# Patient Record
Sex: Male | Born: 1985 | Race: White | Hispanic: No | Marital: Married | Smoking: Never smoker
Health system: Southern US, Community
[De-identification: ages and names within clinical notes are randomized; demographics above are authoritative.]

## PROBLEM LIST (undated history)

## (undated) DIAGNOSIS — K5900 Constipation, unspecified: Secondary | ICD-10-CM

## (undated) DIAGNOSIS — K649 Unspecified hemorrhoids: Secondary | ICD-10-CM

## (undated) DIAGNOSIS — R110 Nausea: Secondary | ICD-10-CM

## (undated) DIAGNOSIS — R197 Diarrhea, unspecified: Secondary | ICD-10-CM

## (undated) DIAGNOSIS — F419 Anxiety disorder, unspecified: Secondary | ICD-10-CM

## (undated) HISTORY — DX: Unspecified hemorrhoids: K64.9

## (undated) HISTORY — DX: Nausea: R11.0

## (undated) HISTORY — PX: OTHER SURGICAL HISTORY: SHX169

## (undated) HISTORY — PX: APPENDECTOMY: SHX54

## (undated) HISTORY — DX: Constipation, unspecified: K59.00

## (undated) HISTORY — DX: Diarrhea, unspecified: R19.7

---
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
## 2005-05-29 ENCOUNTER — Emergency Department (HOSPITAL_COMMUNITY): Admission: EM | Admit: 2005-05-29 | Discharge: 2005-05-29 | Payer: Self-pay | Admitting: Emergency Medicine

## 2007-03-19 ENCOUNTER — Emergency Department (HOSPITAL_COMMUNITY): Admission: EM | Admit: 2007-03-19 | Discharge: 2007-03-19 | Payer: Self-pay | Admitting: Emergency Medicine

## 2015-04-01 ENCOUNTER — Ambulatory Visit: Payer: Self-pay

## 2015-04-01 ENCOUNTER — Other Ambulatory Visit: Payer: Self-pay | Admitting: Occupational Medicine

## 2015-04-01 DIAGNOSIS — M25531 Pain in right wrist: Secondary | ICD-10-CM

## 2015-05-06 ENCOUNTER — Other Ambulatory Visit: Payer: Self-pay | Admitting: Orthopedic Surgery

## 2015-05-15 ENCOUNTER — Encounter (HOSPITAL_BASED_OUTPATIENT_CLINIC_OR_DEPARTMENT_OTHER): Payer: Self-pay | Admitting: *Deleted
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
## 2015-05-21 ENCOUNTER — Encounter (HOSPITAL_BASED_OUTPATIENT_CLINIC_OR_DEPARTMENT_OTHER): Payer: Self-pay | Admitting: Certified Registered"

## 2015-05-21 ENCOUNTER — Ambulatory Visit (HOSPITAL_BASED_OUTPATIENT_CLINIC_OR_DEPARTMENT_OTHER): Payer: Worker's Compensation | Admitting: Certified Registered"

## 2015-05-21 ENCOUNTER — Encounter (HOSPITAL_BASED_OUTPATIENT_CLINIC_OR_DEPARTMENT_OTHER)
Admission: RE | Disposition: A | Discharge status: Home / Self Care | Payer: Self-pay | Source: Ambulatory Visit | Attending: Orthopedic Surgery

## 2015-05-21 ENCOUNTER — Ambulatory Visit (HOSPITAL_BASED_OUTPATIENT_CLINIC_OR_DEPARTMENT_OTHER)
Admission: RE | Admit: 2015-05-21 | Discharge: 2015-05-21 | Disposition: A | Discharge status: Home / Self Care | Payer: Worker's Compensation | Source: Ambulatory Visit | Attending: Orthopedic Surgery | Admitting: Orthopedic Surgery

## 2015-05-21 DIAGNOSIS — Z87891 Personal history of nicotine dependence: Secondary | ICD-10-CM | POA: Diagnosis not present

## 2015-05-21 DIAGNOSIS — Y9289 Other specified places as the place of occurrence of the external cause: Secondary | ICD-10-CM | POA: Diagnosis not present

## 2015-05-21 DIAGNOSIS — Y9389 Activity, other specified: Secondary | ICD-10-CM | POA: Insufficient documentation

## 2015-05-21 DIAGNOSIS — S63591A Other specified sprain of right wrist, initial encounter: Secondary | ICD-10-CM | POA: Insufficient documentation

## 2015-05-21 DIAGNOSIS — Y998 Other external cause status: Secondary | ICD-10-CM | POA: Diagnosis not present

## 2015-05-21 DIAGNOSIS — M25531 Pain in right wrist: Secondary | ICD-10-CM | POA: Diagnosis present

## 2015-05-21 DIAGNOSIS — W19XXXA Unspecified fall, initial encounter: Secondary | ICD-10-CM | POA: Insufficient documentation

## 2015-05-21 HISTORY — DX: Anxiety disorder, unspecified: F41.9

## 2015-05-21 HISTORY — PX: WRIST ARTHROSCOPY WITH FOVEAL TRIANGULAR FIBROCARTILAGE COMPLEX REPAIR: SHX6403

## 2015-05-21 LAB — POCT HEMOGLOBIN-HEMACUE: Hemoglobin: 15.4 g/dL (ref 13.0–17.0)

## 2015-05-21 SURGERY — WRIST ARTHROSCOPY WITH FOVEAL TRIANGULAR FIBROCARTILAGE COMPLEX REPAIR
Anesthesia: General | Laterality: Right

## 2015-05-21 MED ORDER — PROMETHAZINE HCL 25 MG/ML IJ SOLN
6.2500 mg | Freq: Once | INTRAMUSCULAR | Status: AC
  Administered 2015-05-21: 6.25 mg via INTRAVENOUS

## 2015-05-21 MED ORDER — FENTANYL CITRATE (PF) 100 MCG/2ML IJ SOLN
INTRAMUSCULAR | Status: AC
  Filled 2015-05-21: qty 4

## 2015-05-21 MED ORDER — SODIUM CHLORIDE 0.9 % IR SOLN
Status: DC | PRN
  Administered 2015-05-21: 3000 mL

## 2015-05-21 MED ORDER — LIDOCAINE HCL (CARDIAC) 20 MG/ML IV SOLN
INTRAVENOUS | Status: DC | PRN
  Administered 2015-05-21: 30 mg via INTRAVENOUS

## 2015-05-21 MED ORDER — LACTATED RINGERS IV SOLN
INTRAVENOUS | Status: DC
  Administered 2015-05-21 (×2): via INTRAVENOUS

## 2015-05-21 MED ORDER — PROMETHAZINE HCL 25 MG/ML IJ SOLN
INTRAMUSCULAR | Status: AC
Start: 2015-05-21 — End: 2015-05-21
  Filled 2015-05-21: qty 1

## 2015-05-21 MED ORDER — OXYCODONE HCL 5 MG PO TABS: 5.0000 mg | ORAL_TABLET | Freq: Once | ORAL | Status: DC | PRN

## 2015-05-21 MED ORDER — FENTANYL CITRATE (PF) 100 MCG/2ML IJ SOLN
INTRAMUSCULAR | Status: AC
  Filled 2015-05-21: qty 2

## 2015-05-21 MED ORDER — MIDAZOLAM HCL 2 MG/2ML IJ SOLN
INTRAMUSCULAR | Status: AC
  Filled 2015-05-21: qty 2

## 2015-05-21 MED ORDER — MIDAZOLAM HCL 2 MG/2ML IJ SOLN
1.0000 mg | INTRAMUSCULAR | Status: DC | PRN
  Administered 2015-05-21: 1 mg via INTRAVENOUS
  Administered 2015-05-21: 2 mg via INTRAVENOUS

## 2015-05-21 MED ORDER — DEXAMETHASONE SODIUM PHOSPHATE 10 MG/ML IJ SOLN
INTRAMUSCULAR | Status: DC | PRN
Start: 2015-05-21 — End: 2015-05-21
  Administered 2015-05-21: 10 mg via INTRAVENOUS

## 2015-05-21 MED ORDER — MEPERIDINE HCL 25 MG/ML IJ SOLN: 6.2500 mg | INTRAMUSCULAR | Status: DC | PRN

## 2015-05-21 MED ORDER — SUCCINYLCHOLINE CHLORIDE 20 MG/ML IJ SOLN
INTRAMUSCULAR | Status: AC
  Filled 2015-05-21: qty 1

## 2015-05-21 MED ORDER — CEFAZOLIN SODIUM-DEXTROSE 2-3 GM-% IV SOLR
2.0000 g | INTRAVENOUS | Status: AC
  Administered 2015-05-21: 2 g via INTRAVENOUS

## 2015-05-21 MED ORDER — SCOPOLAMINE 1 MG/3DAYS TD PT72: 1.0000 | MEDICATED_PATCH | Freq: Once | TRANSDERMAL | Status: DC | PRN

## 2015-05-21 MED ORDER — PROPOFOL 10 MG/ML IV BOLUS
INTRAVENOUS | Status: DC | PRN
  Administered 2015-05-21: 200 mg via INTRAVENOUS

## 2015-05-21 MED ORDER — CEFAZOLIN SODIUM-DEXTROSE 2-3 GM-% IV SOLR
INTRAVENOUS | Status: AC
  Filled 2015-05-21: qty 50

## 2015-05-21 MED ORDER — FENTANYL CITRATE (PF) 100 MCG/2ML IJ SOLN
50.0000 ug | INTRAMUSCULAR | Status: DC | PRN
  Administered 2015-05-21: 100 ug via INTRAVENOUS

## 2015-05-21 MED ORDER — HYDROMORPHONE HCL 1 MG/ML IJ SOLN: 0.2500 mg | INTRAMUSCULAR | Status: DC | PRN

## 2015-05-21 MED ORDER — GLYCOPYRROLATE 0.2 MG/ML IJ SOLN: 0.2000 mg | Freq: Once | INTRAMUSCULAR | Status: DC | PRN

## 2015-05-21 MED ORDER — LACTATED RINGERS IV SOLN: INTRAVENOUS | Status: DC

## 2015-05-21 MED ORDER — ONDANSETRON HCL 4 MG/2ML IJ SOLN
INTRAMUSCULAR | Status: DC | PRN
  Administered 2015-05-21: 4 mg via INTRAVENOUS

## 2015-05-21 MED ORDER — BUPIVACAINE-EPINEPHRINE (PF) 0.5% -1:200000 IJ SOLN
INTRAMUSCULAR | Status: DC | PRN
  Administered 2015-05-21: 25 mL via PERINEURAL

## 2015-05-21 MED ORDER — OXYCODONE HCL 5 MG/5ML PO SOLN: 5.0000 mg | Freq: Once | ORAL | Status: DC | PRN

## 2015-05-21 SURGICAL SUPPLY — 78 items
APL SKNCLS STERI-STRIP NONHPOA (GAUZE/BANDAGES/DRESSINGS)
BENZOIN TINCTURE PRP APPL 2/3 (GAUZE/BANDAGES/DRESSINGS) IMPLANT
BLADE MINI RND TIP GREEN BEAV (BLADE) IMPLANT
BLADE SURG 15 STRL LF DISP TIS (BLADE) ×1 IMPLANT
BLADE SURG 15 STRL SS (BLADE) ×3
BNDG CMPR 9X4 STRL LF SNTH (GAUZE/BANDAGES/DRESSINGS) ×1
BNDG COHESIVE 4X5 TAN STRL (GAUZE/BANDAGES/DRESSINGS) ×3 IMPLANT
BNDG ESMARK 4X9 LF (GAUZE/BANDAGES/DRESSINGS) ×3 IMPLANT
BNDG GAUZE ELAST 4 BULKY (GAUZE/BANDAGES/DRESSINGS) ×6 IMPLANT
BUR CUDA 2.9 (BURR) ×1 IMPLANT
BUR CUDA 2.9MM (BURR) ×1
BUR FULL RADIUS 2.0 (BURR) IMPLANT
BUR FULL RADIUS 2.0MM (BURR)
BUR FULL RADIUS 2.9 (BURR) IMPLANT
BUR FULL RADIUS 2.9MM (BURR)
BUR GATOR 2.9 (BURR) IMPLANT
BUR GATOR 2.9MM (BURR)
CANISTER SUCT 1200ML W/VALVE (MISCELLANEOUS) ×3 IMPLANT
CHLORAPREP W/TINT 26ML (MISCELLANEOUS) ×3 IMPLANT
CLOSURE WOUND 1/2 X4 (GAUZE/BANDAGES/DRESSINGS)
CORDS BIPOLAR (ELECTRODE) IMPLANT
COVER BACK TABLE 60X90IN (DRAPES) ×3 IMPLANT
COVER MAYO STAND STRL (DRAPES) ×3 IMPLANT
CUFF TOURNIQUET SINGLE 18IN (TOURNIQUET CUFF) ×3 IMPLANT
DECANTER SPIKE VIAL GLASS SM (MISCELLANEOUS) IMPLANT
DRAPE EXTREMITY T 121X128X90 (DRAPE) ×3 IMPLANT
DRAPE OEC MINIVIEW 54X84 (DRAPES) IMPLANT
DRAPE SURG 17X23 STRL (DRAPES) ×3 IMPLANT
DRSG EMULSION OIL 3X3 NADH (GAUZE/BANDAGES/DRESSINGS) ×3 IMPLANT
DRSG TEGADERM 4X4.75 (GAUZE/BANDAGES/DRESSINGS) IMPLANT
GAUZE SPONGE 4X4 12PLY STRL (GAUZE/BANDAGES/DRESSINGS) ×3 IMPLANT
GLOVE BIO SURGEON STRL SZ 6.5 (GLOVE) ×1 IMPLANT
GLOVE BIO SURGEON STRL SZ7.5 (GLOVE) ×3 IMPLANT
GLOVE BIO SURGEONS STRL SZ 6.5 (GLOVE) ×1
GLOVE BIOGEL PI IND STRL 7.0 (GLOVE) ×1 IMPLANT
GLOVE BIOGEL PI IND STRL 8 (GLOVE) ×1 IMPLANT
GLOVE BIOGEL PI INDICATOR 7.0 (GLOVE) ×6
GLOVE BIOGEL PI INDICATOR 8 (GLOVE) ×2
GLOVE ECLIPSE 6.5 STRL STRAW (GLOVE) ×5 IMPLANT
GOWN STRL REUS W/ TWL LRG LVL3 (GOWN DISPOSABLE) ×2 IMPLANT
GOWN STRL REUS W/TWL LRG LVL3 (GOWN DISPOSABLE) ×6
GOWN STRL REUS W/TWL XL LVL3 (GOWN DISPOSABLE) ×3 IMPLANT
LIQUID BAND (GAUZE/BANDAGES/DRESSINGS) IMPLANT
MANIFOLD NEPTUNE II (INSTRUMENTS) IMPLANT
NEEDLE HYPO 22GX1.5 SAFETY (NEEDLE) ×3 IMPLANT
PACK BASIN DAY SURGERY FS (CUSTOM PROCEDURE TRAY) ×3 IMPLANT
PADDING CAST ABS 4INX4YD NS (CAST SUPPLIES) ×2
PADDING CAST ABS COTTON 4X4 ST (CAST SUPPLIES) ×1 IMPLANT
RETRIEVER SUT HEWSON (MISCELLANEOUS) IMPLANT
SET SM JOINT TUBING/CANN (CANNULA) ×3 IMPLANT
SHEET MEDIUM DRAPE 40X70 STRL (DRAPES) IMPLANT
SLING ARM FOAM STRAP LRG (SOFTGOODS) ×2 IMPLANT
SLING ARM LRG ADULT FOAM STRAP (SOFTGOODS) IMPLANT
SLING ARM MED ADULT FOAM STRAP (SOFTGOODS) IMPLANT
SLING ARM SM FOAM STRAP (SOFTGOODS) IMPLANT
SLING ARM XL FOAM STRAP (SOFTGOODS) IMPLANT
SPLINT FIBERGLASS 3X35 (CAST SUPPLIES) ×2 IMPLANT
STOCKINETTE 6  STRL (DRAPES) ×2
STOCKINETTE 6 STRL (DRAPES) ×1 IMPLANT
STRIP CLOSURE SKIN 1/2X4 (GAUZE/BANDAGES/DRESSINGS) IMPLANT
SUT ETHIBOND 2 OS 4 DA (SUTURE) IMPLANT
SUT LASSO 70 DEG MICRO SHORT (SUTURE) ×3
SUT LASSO MICRO MINOR BEND (SUTURE) ×2 IMPLANT
SUT LASSO MICRO STR (SUTURE) ×2 IMPLANT
SUT PDS AB 0 CT 36 (SUTURE) ×2 IMPLANT
SUT VICRYL RAPIDE 4-0 (SUTURE) IMPLANT
SUT VICRYL RAPIDE 4/0 PS 2 (SUTURE) ×4 IMPLANT
SUTURE LASSO 70 DEG MICR SHORT (SUTURE) IMPLANT
SYR BULB 3OZ (MISCELLANEOUS) IMPLANT
SYR CONTROL 10ML LL (SYRINGE) ×5 IMPLANT
SYRINGE 10CC LL (SYRINGE) IMPLANT
TOWEL OR 17X24 6PK STRL BLUE (TOWEL DISPOSABLE) ×3 IMPLANT
TOWEL OR NON WOVEN STRL DISP B (DISPOSABLE) ×3 IMPLANT
TRAP DIGIT (INSTRUMENTS) ×2 IMPLANT
TRAP FINGER LRG (INSTRUMENTS) IMPLANT
TUBE CONNECTING 20'X1/4 (TUBING) ×1
TUBE CONNECTING 20X1/4 (TUBING) ×2 IMPLANT
WAND 1.5 MICROBLATOR (SURGICAL WAND) IMPLANT

## 2015-05-21 NOTE — Discharge Instructions (Addendum)
Discharge Instructions   You have a dressing with a plaster splint incorporated in it. Move your fingers as much as possible, making a full fist and fully opening the fist. Elevate your hand to reduce pain & swelling of the digits.  Ice over the operative site may be helpful to reduce pain & swelling.  DO NOT USE HEAT. Pain medicine has been prescribed for you.  Use your medicine as needed over the first 48 hours, and then you can begin to taper your use.  You may use Tylenol in place of your prescribed pain medication, but not IN ADDITION to it. Leave the dressing in place until you return to our office.  You may shower, but keep the bandage clean & dry.  You may drive a car when you are off of prescription pain medications and can safely control your vehicle with both hands. Our office will call you to arrange follow-up   Please call [phone] during normal business hours or [phone] after hours for any problems. Including the following:  - excessive redness of the incisions - drainage for more than 4 days - fever of more than 101.5 F  *Please note that pain medications will not be refilled after hours or on weekends.   Post Anesthesia Home Care Instructions  Activity: Get plenty of rest for the remainder of the day. A responsible adult should stay with you for 24 hours following the procedure.  For the next 24 hours, DO NOT: -Drive a car -Paediatric nurse -Drink alcoholic beverages -Take any medication unless instructed by your physician -Make any legal decisions or sign important papers.  Meals: Start with liquid foods such as gelatin or soup. Progress to regular foods as tolerated. Avoid greasy, spicy, heavy foods. If nausea and/or vomiting occur, drink only clear liquids until the nausea and/or vomiting subsides. Call your physician if vomiting continues.  Special Instructions/Symptoms: Your throat may feel dry or sore from the anesthesia or the breathing tube  placed in your throat during surgery. If this causes discomfort, gargle with warm salt water. The discomfort should disappear within 24 hours.  If you had a scopolamine patch placed behind your ear for the management of post- operative nausea and/or vomiting:  1. The medication in the patch is effective for 72 hours, after which it should be removed.  Wrap patch in a tissue and discard in the trash. Wash hands thoroughly with soap and water. 2. You may remove the patch earlier than 72 hours if you experience unpleasant side effects which may include dry mouth, dizziness or visual disturbances. 3. Avoid touching the patch. Wash your hands with soap and water after contact with the patch.

## 2015-05-21 NOTE — Op Note (Signed)
05/21/2015  11:35 AM  PATIENT:  Daniel Gibbs  29 y.o. 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 Grandville Silos, MD  PHYSICIAN ASSISTANT: None  ANESTHESIA:  regional and general  SPECIMENS:  None  DRAINS:   None  EBL:  less than 50 mL  PREOPERATIVE INDICATIONS:  Daniel Gibbs is a  29 y.o. male with right ulnar sided wrist pain and clicking following a fall, with MRI suggestive of TFCC tear  The risks benefits and alternatives were discussed with the patient preoperatively including but not limited to the risks of infection, bleeding, nerve injury, cardiopulmonary complications, the need for revision surgery, among others, and the patient verbalized understanding and consented to proceed.  OPERATIVE IMPLANTS: None  OPERATIVE PROCEDURE:  After receiving prophylactic antibiotics and a regional block, the patient was escorted to the operative theatre and placed in a supine position.  General anesthesia was administered A surgical "time-out" was performed during which the planned procedure, proposed operative site, and the correct patient identity were compared to the operative consent and agreement confirmed by the circulating nurse according to current facility policy.  Following application of a tourniquet to the operative extremity, the exposed skin was prepped with Chloraprep and draped in the usual sterile fashion.  The limb was exsanguinated with an Esmarch bandage and the tourniquet inflated to approximately 171mmHg higher than systolic BP.  The right hand and arm was positioned in the [employer] traction tower and subjected 12 pounds of traction. Portals were marked and the joint insufflated with saline. Standard 3-4 portal was  established. Viewing commenced. The articular cartilage was in good condition without significant chondromalacia. There was dorsal synovitis of the prestyloid recess and extending radially and dorsally. TFCC displaced central perforation, but with good chondral surfaces on either side of it, to include the distal ulna and the proximal lunate. In fact, the edges were thin and tapered, suggesting that this actually could even be a long-standing issue or developmental variant. Nonetheless, the central tear was debrided back to stable rims. The undersurface of the TFCC was well anchored into the distal ulna. 6R portal was established and by first placing a probe and then the suction shaver, it appeared as if there was a dorsal peripheral TFCC tear that had been covered over by some early healing and synovium. This was debrided and ultimately the 6R portal was extended and the fifth compartment opened. TFCC dorsal peripheral tear was performed with 0 PDS suture and Arthrex instruments with 2 simple sutures reapproximating the TFCC to the dorsal capsule. This reestablished tension within the dorsal limb of the TFCC. Radial midcarpal portal was then established and both the scapholunate and the lunotriquetral ligaments were found to be intact. The arthroscopic instruments were removed, a portion of the retinaculum over the fifth compartment was closed over the PDS knots leaving a part of the fifth compartment tendons bus outside the sheath. The skin was closed with combination of interrupted and running 4-0 Vicryl Rapide suture. A bulky sugar tong splint was applied with the forearm slightly pronated and he was awakened and taken to the recovery room in stable condition, breathing spontaneously  DISPOSITION: He'll be discharged home today with typical instructions, returning likely next week to have a custom splint constructed and begin rehab.

## 2015-05-21 NOTE — Interval H&P Note (Signed)
History and Physical Interval Note:  05/21/2015 11:35 AM  Daniel Gibbs  has presented today for surgery, with the diagnosis of Right Wrist pain with suspeted Triangular Fibrocartilage Complex tear  The various methods of treatment have been discussed with the patient and family. After consideration of risks, benefits and other options for treatment, the patient has consented to  Procedure(s) with comments: WRIST ARTHROSCOPY WITH POSSIBLE TRIANGULAR FIBROCARTILAGE COMPLEX REPAIR (Right) - RIGHT WRIST SCOPE WITH POSSIBLE TFCC REPAIR.  GENERAL ANESTHESIA WITH PRE-OP BLOCK as a surgical intervention .  The patient's history has been reviewed, patient examined, no change in status, stable for surgery.  I have reviewed the patient's chart and labs.  Questions were answered to the patient's satisfaction.     [name]

## 2015-05-21 NOTE — Anesthesia Postprocedure Evaluation (Signed)
  Anesthesia Post-op Note  Patient: Daniel Gibbs  Procedure(s) Performed: Procedure(s) with comments: WRIST ARTHROSCOPY WITH TRIANGULAR FIBROCARTILAGE COMPLEX REPAIR (Right) - RIGHT WRIST SCOPE WITH TFCC REPAIR.  GENERAL ANESTHESIA WITH PRE-OP BLOCK  Patient Location: PACU  Anesthesia Type: General, Regional   Level of Consciousness: awake, alert  and oriented  Airway and Oxygen Therapy: Patient Spontanous Breathing  Post-op Pain: none  Post-op Assessment: Post-op Vital signs reviewed  Post-op Vital Signs: Reviewed  Last Vitals:  Filed Vitals:   05/21/15 1630  BP: 117/75  Pulse:   Temp:   Resp:     Complications: No apparent anesthesia complications

## 2015-05-21 NOTE — Transfer of Care (Signed)
Immediate Anesthesia Transfer of Care Note  Patient: Jamarkis Branam [place]  Procedure(s) Performed: Procedure(s) with comments: WRIST ARTHROSCOPY WITH TRIANGULAR FIBROCARTILAGE COMPLEX REPAIR (Right) - RIGHT WRIST SCOPE WITH TFCC REPAIR.  GENERAL ANESTHESIA WITH PRE-OP BLOCK  Patient Location: PACU  Anesthesia Type:GA combined with regional for post-op pain  Level of Consciousness: awake and patient cooperative  Airway & Oxygen Therapy: Patient Spontanous Breathing and Patient connected to face mask oxygen  Post-op Assessment: Report given to RN and Post -op Vital signs reviewed and stable  Post vital signs: Reviewed and stable  Last Vitals:  Filed Vitals:   05/21/15 1355  BP: 127/84  Pulse: 87  Resp: 16    Complications: No apparent anesthesia complications

## 2015-05-21 NOTE — Anesthesia Procedure Notes (Addendum)
Procedure Name: LMA Insertion Date/Time: 05/21/2015 2:13 PM Performed by: BLOCKER, TIMOTHY D Pre-anesthesia Checklist: Patient identified, Emergency Drugs available, Suction available and Patient being monitored Patient Re-evaluated:Patient Re-evaluated prior to inductionOxygen Delivery Method: Circle System Utilized Preoxygenation: Pre-oxygenation with 100% oxygen Intubation Type: IV induction Ventilation: Mask ventilation without difficulty LMA: LMA inserted LMA Size: 4.0 Number of attempts: 1 Airway Equipment and Method: Bite block Placement Confirmation: positive ETCO2 Tube secured with: Tape Dental Injury: Teeth and Oropharynx as per pre-operative assessment    Anesthesia Regional Block:  Supraclavicular block  Pre-Anesthetic Checklist: ,, timeout performed, Correct Patient, Correct Site, Correct Laterality, Correct Procedure, Correct Position, site marked, Risks and benefits discussed,  Surgical consent,  Pre-op evaluation,  At surgeon's request and post-op pain management  Laterality: Right and Upper  Prep: chloraprep       Needles:  Injection technique: Single-shot  Needle Type: Echogenic Stimulator Needle     Needle Length: 5cm 5 cm Needle Gauge: 21 and 21 G    Additional Needles:  Procedures: ultrasound guided (picture in chart) Supraclavicular block Narrative:  Start time: 05/21/2015 1:52 PM End time: 05/21/2015 1:56 PM Injection made incrementally with aspirations every 5 mL.  Performed by: Personally  Anesthesiologist: Tanise Russman

## 2015-05-21 NOTE — Progress Notes (Signed)
Assisted Dr. Crews with right, ultrasound guided, supraclavicular block. Side rails up, monitors on throughout procedure. See vital signs in flow sheet. Tolerated Procedure well. 

## 2015-05-21 NOTE — Anesthesia Preprocedure Evaluation (Signed)
Anesthesia Evaluation  Patient identified by MRN, date of birth, ID band Patient awake    Reviewed: Allergy & Precautions, NPO status , Patient's Chart, lab work & pertinent test results  Airway Mallampati: I  TM Distance: >3 FB Neck ROM: Full    Dental  (+) Teeth Intact, Dental Advisory Given   Pulmonary former smoker,  breath sounds clear to auscultation        Cardiovascular Rhythm:Regular Rate:Normal     Neuro/Psych    GI/Hepatic   Endo/Other    Renal/GU      Musculoskeletal   Abdominal   Peds  Hematology   Anesthesia Other Findings   Reproductive/Obstetrics                             Anesthesia Physical Anesthesia Plan  ASA: I  Anesthesia Plan: General   Post-op Pain Management:    Induction: Intravenous  Airway Management Planned: LMA  Additional Equipment:   Intra-op Plan:   Post-operative Plan: Extubation in OR  Informed Consent: I have reviewed the patients History and Physical, chart, labs and discussed the procedure including the risks, benefits and alternatives for the proposed anesthesia with the patient or authorized representative who has indicated his/her understanding and acceptance.   Dental advisory given  Plan Discussed with: CRNA, Anesthesiologist and Surgeon  Anesthesia Plan Comments:        Anesthesia Quick Evaluation  

## 2015-05-22 ENCOUNTER — Encounter (HOSPITAL_BASED_OUTPATIENT_CLINIC_OR_DEPARTMENT_OTHER): Payer: Self-pay | Admitting: Orthopedic Surgery

## 2016-03-15 ENCOUNTER — Encounter (HOSPITAL_COMMUNITY): Payer: Self-pay | Admitting: Emergency Medicine

## 2016-03-15 ENCOUNTER — Emergency Department (HOSPITAL_COMMUNITY)
Admission: EM | Admit: 2016-03-15 | Discharge: 2016-03-15 | Disposition: A | Discharge status: Home / Self Care | Payer: BC Managed Care – PPO | Attending: Emergency Medicine | Admitting: Emergency Medicine

## 2016-03-15 DIAGNOSIS — Z79899 Other long term (current) drug therapy: Secondary | ICD-10-CM | POA: Diagnosis not present

## 2016-03-15 DIAGNOSIS — Z9049 Acquired absence of other specified parts of digestive tract: Secondary | ICD-10-CM | POA: Insufficient documentation

## 2016-03-15 DIAGNOSIS — Z87891 Personal history of nicotine dependence: Secondary | ICD-10-CM | POA: Insufficient documentation

## 2016-03-15 DIAGNOSIS — R1030 Lower abdominal pain, unspecified: Secondary | ICD-10-CM | POA: Insufficient documentation

## 2016-03-15 DIAGNOSIS — R10A0 Flank pain, unspecified side: Secondary | ICD-10-CM

## 2016-03-15 DIAGNOSIS — R109 Unspecified abdominal pain: Secondary | ICD-10-CM

## 2016-03-15 DIAGNOSIS — F419 Anxiety disorder, unspecified: Secondary | ICD-10-CM | POA: Insufficient documentation

## 2016-03-15 DIAGNOSIS — Z87448 Personal history of other diseases of urinary system: Secondary | ICD-10-CM | POA: Diagnosis not present

## 2016-03-15 DIAGNOSIS — R197 Diarrhea, unspecified: Secondary | ICD-10-CM | POA: Insufficient documentation

## 2016-03-15 LAB — URINALYSIS, ROUTINE W REFLEX MICROSCOPIC
Bilirubin Urine: NEGATIVE
Glucose, UA: NEGATIVE mg/dL
HGB URINE DIPSTICK: NEGATIVE
Ketones, ur: NEGATIVE mg/dL
Leukocytes, UA: NEGATIVE
Nitrite: NEGATIVE
PH: 7 (ref 5.0–8.0)
PROTEIN: NEGATIVE mg/dL
SPECIFIC GRAVITY, URINE: 1.006 (ref 1.005–1.030)

## 2016-03-15 LAB — CBC
HEMATOCRIT: 43.9 % (ref 39.0–52.0)
HEMOGLOBIN: 14.5 g/dL (ref 13.0–17.0)
MCH: 30.1 pg (ref 26.0–34.0)
MCHC: 33 g/dL (ref 30.0–36.0)
MCV: 91.1 fL (ref 78.0–100.0)
Platelets: 270 10*3/uL (ref 150–400)
RBC: 4.82 MIL/uL (ref 4.22–5.81)
RDW: 13.4 % (ref 11.5–15.5)
WBC: 7.5 10*3/uL (ref 4.0–10.5)

## 2016-03-15 LAB — LIPASE, BLOOD: LIPASE: 20 U/L (ref 11–51)

## 2016-03-15 LAB — COMPREHENSIVE METABOLIC PANEL
ALBUMIN: 4 g/dL (ref 3.5–5.0)
ALT: 24 U/L (ref 17–63)
ANION GAP: 10 (ref 5–15)
AST: 21 U/L (ref 15–41)
Alkaline Phosphatase: 48 U/L (ref 38–126)
BUN: 13 mg/dL (ref 6–20)
CHLORIDE: 102 mmol/L (ref 101–111)
CO2: 26 mmol/L (ref 22–32)
Calcium: 9.1 mg/dL (ref 8.9–10.3)
Creatinine, Ser: 1.13 mg/dL (ref 0.61–1.24)
GFR calc Af Amer: >60 mL/min (ref >60)
GFR calc non Af Amer: >60 mL/min (ref >60)
GLUCOSE: 94 mg/dL (ref 65–99)
POTASSIUM: 4.1 mmol/L (ref 3.5–5.1)
Sodium: 138 mmol/L (ref 135–145)
Total Bilirubin: 0.4 mg/dL (ref 0.3–1.2)
Total Protein: 7.3 g/dL (ref 6.5–8.1)

## 2016-03-15 MED ORDER — CYCLOBENZAPRINE HCL 10 MG PO TABS: 10.0000 mg | ORAL_TABLET | Freq: Three times a day (TID) | ORAL | Status: DC | PRN

## 2016-03-15 MED ORDER — CYCLOBENZAPRINE HCL 10 MG PO TABS
10.0000 mg | ORAL_TABLET | Freq: Once | ORAL | Status: AC
  Administered 2016-03-15: 10 mg via ORAL
  Filled 2016-03-15: qty 1

## 2016-03-15 MED ORDER — ACETAMINOPHEN 500 MG PO TABS
1000.0000 mg | ORAL_TABLET | Freq: Once | ORAL | Status: AC
  Administered 2016-03-15: 1000 mg via ORAL
  Filled 2016-03-15: qty 2

## 2016-03-15 NOTE — ED Provider Notes (Signed)
CSN: [id number]     Arrival date & time 03/15/16  1702 History   First MD Initiated Contact with Patient 03/15/16 2023     Chief Complaint  Patient presents with  . Flank Pain  . Abdominal Pain     (Consider location/radiation/quality/duration/timing/severity/associated sxs/prior Treatment) Patient is a 30 y.o. male presenting with general illness. The history is provided by the patient and a friend.  Illness Location:  Lower abdominal and left flank pain Severity:  Mild Onset quality:  Gradual Duration:  1 week Timing:  Constant Progression:  Worsening Chronicity:  New Context:  Patient reports one week of lower abdominal pain and left flank pain, getting progressively worse. Does endorse some recent loose stool. No urinary symptoms. No testicular pain. Associated symptoms: abdominal pain and diarrhea   Associated symptoms: no chest pain, no cough, no fever, no headaches, no nausea, no shortness of breath and no vomiting     Past Medical History  Diagnosis Date  . Anxiety    Past Surgical History  Procedure Laterality Date  . Appendectomy    . Surgery for urinary reflux      on left and right   . Wrist arthroscopy with foveal triangular fibrocartilage complex repair Right 05/21/2015    Procedure: WRIST ARTHROSCOPY WITH TRIANGULAR FIBROCARTILAGE COMPLEX REPAIR;  Surgeon: Milly Jakob, MD;  Location: [place];  Service: Orthopedics;  Laterality: Right;  RIGHT WRIST SCOPE WITH TFCC REPAIR.  GENERAL ANESTHESIA WITH PRE-OP BLOCK   No family history on file. Social History  Substance Use Topics  . Smoking status: Former Smoker    Types: Cigarettes  . Smokeless tobacco: Current User    Types: Chew  . Alcohol Use: Yes     Comment: occas    Review of Systems  Constitutional: Negative for fever and chills.  Respiratory: Negative for cough and shortness of breath.   Cardiovascular: Negative for chest pain.  Gastrointestinal: Positive for abdominal pain and  diarrhea. Negative for nausea, vomiting and blood in stool.  Genitourinary: Positive for flank pain. Negative for dysuria, frequency, hematuria, discharge, penile swelling, scrotal swelling, genital sores, penile pain and testicular pain.  Musculoskeletal: Negative for back pain.  Neurological: Negative for light-headedness and headaches.  All other systems reviewed and are negative.     Allergies  Bee venom and Sulfate  Home Medications   Prior to Admission medications   Medication Sig Start Date End Date Taking? Authorizing Provider  acetaminophen (TYLENOL) 325 MG tablet Take 650 mg by mouth every 6 (six) hours as needed.    Historical Provider, MD  clonazePAM (KLONOPIN) 1 MG tablet Take 1 mg by mouth 2 (two) times daily.    Historical Provider, MD  cyclobenzaprine (FLEXERIL) 10 MG tablet Take 1 tablet (10 mg total) by mouth 3 (three) times daily as needed for muscle spasms. 03/15/16   Maryan Puls, MD  EPINEPHrine 0.3 mg/0.3 mL IJ SOAJ injection Inject into the muscle once.    Historical Provider, MD  fexofenadine-pseudoephedrine (ALLEGRA-D 24) 180-240 MG per 24 hr tablet Take 1 tablet by mouth daily.    Historical Provider, MD  Multiple Vitamins-Minerals (MULTIVITAMIN ADULT PO) Take 2 tablets by mouth.    Historical Provider, MD  oxyCODONE-acetaminophen (PERCOCET/ROXICET) 5-325 MG per tablet Take 1-2 tablets by mouth every 6 (six) hours as needed for severe pain.    Historical Provider, MD  sertraline (ZOLOFT) 25 MG tablet Take 25 mg by mouth daily.    Historical Provider, MD   BP 114/74  mmHg  Pulse 61  Temp(Src) 98.3 F (36.8 C) (Oral)  Resp 16  Ht 6\' 1"  (1.854 m)  Wt 86.183 kg  BMI 25.07 kg/m2  SpO2 100% Physical Exam  Constitutional: He is oriented to person, place, and time. He appears well-developed and well-nourished. No distress.  HENT:  Head: Normocephalic and atraumatic.  Eyes: EOM are normal. Pupils are equal, round, and reactive to light.  Cardiovascular: Normal  rate, regular rhythm and intact distal pulses.   Pulmonary/Chest: Effort normal. No respiratory distress.  Abdominal: Soft. There is tenderness in the suprapubic area. There is CVA tenderness (left). There is no rebound, no guarding, no tenderness at McBurney's point and negative Murphy's sign.  Musculoskeletal: He exhibits no edema.  Neurological: He is alert and oriented to person, place, and time.  Skin: Skin is warm and dry.    ED Course  Procedures (including critical care time) Labs Review Labs Reviewed  LIPASE, BLOOD  COMPREHENSIVE METABOLIC PANEL  CBC  URINALYSIS, ROUTINE W REFLEX MICROSCOPIC (NOT AT [facility])    Imaging Review No results found. I have personally reviewed and evaluated these images and lab results as part of my medical decision-making.   EKG Interpretation None      MDM   Final diagnoses:  Flank pain    Patient with a history of urinary tract reflux status post surgical intervention almost 20 years ago presenting with likely muscle spasm. Present over the last week. Located in his lower abdomen as well as left lower back. Denies any fevers and chills. No other urinary symptoms. No hematuria. No nausea or vomiting. Did have some loose stool over the last couple days. Nonbloody.  Patient is well-appearing here. Afebrile. Not tachycardic, not tachypnea, not hypoxic, normotensive. Abdomen soft without peritoneal signs. Doubt acute abdomen. Does have reproducible discomfort in the lower abdomen as well as left lower flank. Labs on the patient were obtained. Lipase normal, doubt pancreatitis. Electrolytes within normal limits. Creatinine 1.13; possible mild dehydration. Encouraged him to push PO fluids at home and to have a recheck with his primary care doctor. No leukocytosis. No anemia. Urinalysis shows no hematuria. Doubt nephrolithiasis lithiasis. Urinalysis shows no evidence of UTI. Denies testicular or penile pain. Doubt testicular torsion. Possible that the  discomfort could be related to his multiple recent bouts of diarrhea.  Unclear what is causing the patient's discomfort at this time. Gave him some Tylenol and Flexeril here with improvement in pain. We'll give him a prescription for Flexeril. We'll have him follow-up with his primary care doctor in 2-3 days for further evaluation, repeat creatinine and urinalysis, and potential referral to urology given his complicated past medical history if deemed appropriate.  Patient discharged in stable condition.   Maryan Puls, MD 03/16/16 [place], MD 03/16/16 [phone]

## 2016-03-15 NOTE — ED Notes (Signed)
C/o lower abd pain/ "spasms" and L flank pain x 1 week.  Reports nausea and dysuria today.  Urinated x 1 today and states it had a foul odor.  Pt believes symptoms are due to urinary reflux- surgery for same x 2.

## 2016-08-19 ENCOUNTER — Encounter (HOSPITAL_COMMUNITY): Payer: Self-pay | Admitting: Emergency Medicine

## 2016-08-19 ENCOUNTER — Emergency Department (HOSPITAL_COMMUNITY): Payer: BC Managed Care – PPO

## 2016-08-19 ENCOUNTER — Emergency Department (HOSPITAL_COMMUNITY)
Admission: EM | Admit: 2016-08-19 | Discharge: 2016-08-19 | Disposition: A | Discharge status: Home / Self Care | Payer: BC Managed Care – PPO | Attending: Emergency Medicine | Admitting: Emergency Medicine

## 2016-08-19 DIAGNOSIS — X501XXA Overexertion from prolonged static or awkward postures, initial encounter: Secondary | ICD-10-CM | POA: Insufficient documentation

## 2016-08-19 DIAGNOSIS — Y939 Activity, unspecified: Secondary | ICD-10-CM | POA: Insufficient documentation

## 2016-08-19 DIAGNOSIS — Y92009 Unspecified place in unspecified non-institutional (private) residence as the place of occurrence of the external cause: Secondary | ICD-10-CM | POA: Insufficient documentation

## 2016-08-19 DIAGNOSIS — S93602A Unspecified sprain of left foot, initial encounter: Secondary | ICD-10-CM | POA: Insufficient documentation

## 2016-08-19 DIAGNOSIS — F17220 Nicotine dependence, chewing tobacco, uncomplicated: Secondary | ICD-10-CM | POA: Diagnosis not present

## 2016-08-19 DIAGNOSIS — Y999 Unspecified external cause status: Secondary | ICD-10-CM | POA: Diagnosis not present

## 2016-08-19 DIAGNOSIS — S99922A Unspecified injury of left foot, initial encounter: Secondary | ICD-10-CM | POA: Diagnosis present

## 2016-08-19 MED ORDER — NAPROXEN 500 MG PO TABS: ORAL_TABLET | ORAL | 0 refills | Status: DC

## 2016-08-19 NOTE — ED Provider Notes (Signed)
[facility] DEPT Provider Note   CSN: [id number] Arrival date & time: 08/19/16  2052  By signing my name below, I, Dora Sims, attest that this documentation has been prepared under the direction and in the presence of physician practitioner, Milton Ferguson, MD. Electronically Signed: Dora Sims, Scribe. 08/19/2016. 10:08 PM.  History   Chief Complaint Chief Complaint  Patient presents with  . Ankle Pain    The history is provided by the patient. No language interpreter was used.  Ankle Pain   The incident occurred 3 to 5 hours ago. The incident occurred at home. The pain is present in the left ankle and left foot. The pain has been constant since onset. Associated symptoms include numbness (left ankle, left foot) and inability to bear weight. He reports no foreign bodies present. The symptoms are aggravated by palpation. He has tried nothing for the symptoms.     HPI Comments: Daniel Gibbs is a 30 y.o. male brought in by his girlfriend who presents to the Emergency Department complaining of sudden onset, constant, left ankle pain beginning about 3 hours ago. Pt reports he "took a step wrong" and twisted his left ankle. He reports associated swelling. He notes pain radiation into his left foot as well as numbness to his left ankle and left foot. Pt endorses pain exacerbation with palpation to his left ankle/foot. He states he cannot bear weight on his left foot due to pain. He denies bruising, wounds, or any other associated symptoms.  Past Medical History:  Diagnosis Date  . Anxiety     There are no active problems to display for this patient.   Past Surgical History:  Procedure Laterality Date  . APPENDECTOMY    . Surgery for urinary reflux     on left and right   . WRIST ARTHROSCOPY WITH FOVEAL TRIANGULAR FIBROCARTILAGE COMPLEX REPAIR Right 05/21/2015   Procedure: WRIST ARTHROSCOPY WITH TRIANGULAR FIBROCARTILAGE COMPLEX REPAIR;  Surgeon: Milly Jakob, MD;   Location: [facility];  Service: Orthopedics;  Laterality: Right;  RIGHT WRIST SCOPE WITH TFCC REPAIR.  GENERAL ANESTHESIA WITH PRE-OP BLOCK       Home Medications    Prior to Admission medications   Medication Sig Start Date End Date Taking? Authorizing Provider  acetaminophen (TYLENOL) 325 MG tablet Take 650 mg by mouth every 6 (six) hours as needed.    Historical Provider, MD  clonazePAM (KLONOPIN) 1 MG tablet Take 1 mg by mouth 2 (two) times daily.    Historical Provider, MD  cyclobenzaprine (FLEXERIL) 10 MG tablet Take 1 tablet (10 mg total) by mouth 3 (three) times daily as needed for muscle spasms. 03/15/16   Maryan Puls, MD  EPINEPHrine 0.3 mg/0.3 mL IJ SOAJ injection Inject into the muscle once.    Historical Provider, MD  fexofenadine-pseudoephedrine (ALLEGRA-D 24) 180-240 MG per 24 hr tablet Take 1 tablet by mouth daily.    Historical Provider, MD  Multiple Vitamins-Minerals (MULTIVITAMIN ADULT PO) Take 2 tablets by mouth.    Historical Provider, MD  oxyCODONE-acetaminophen (PERCOCET/ROXICET) 5-325 MG per tablet Take 1-2 tablets by mouth every 6 (six) hours as needed for severe pain.    Historical Provider, MD  sertraline (ZOLOFT) 25 MG tablet Take 25 mg by mouth daily.    Historical Provider, MD    Family History No family history on file.  Social History Social History  Substance Use Topics  . Smoking status: Former Smoker    Types: Cigarettes  . Smokeless tobacco: Current User  Types: Chew  . Alcohol use Yes     Comment: occas     Allergies   Bee venom and Sulfate   Review of Systems Review of Systems  Constitutional: Negative for appetite change and fatigue.  HENT: Negative for congestion, ear discharge and sinus pressure.   Eyes: Negative for discharge.  Respiratory: Negative for cough.   Cardiovascular: Negative for chest pain.  Gastrointestinal: Negative for abdominal pain and diarrhea.  Genitourinary: Negative for frequency and  hematuria.  Musculoskeletal: Positive for arthralgias (left ankle, left foot) and joint swelling (left ankle). Negative for back pain.  Skin: Negative for color change, rash and wound.  Neurological: Positive for numbness (left ankle, left foot). Negative for seizures and headaches.  Psychiatric/Behavioral: Negative for hallucinations.    Physical Exam Updated Vital Signs BP 118/75 (BP Location: Left Arm)   Pulse 77   Temp 98.9 F (37.2 C) (Oral)   Resp 20   Ht 6\' 1"  (1.854 m)   Wt 210 lb (95.3 kg)   SpO2 98%   BMI 27.71 kg/m   Physical Exam  Constitutional: He is oriented to person, place, and time. He appears well-developed.  HENT:  Head: Normocephalic.  Eyes: Conjunctivae are normal.  Neck: No tracheal deviation present.  Cardiovascular:  No murmur heard. Musculoskeletal: Normal range of motion.  Tender mid-left foot medially  Neurological: He is oriented to person, place, and time.  Skin: Skin is warm.  Psychiatric: He has a normal mood and affect.    ED Treatments / Results  Labs (all labs ordered are listed, but only abnormal results are displayed) Labs Reviewed - No data to display  EKG  EKG Interpretation None       Radiology Dg Ankle Complete Left  Result Date: 08/19/2016 CLINICAL DATA:  Medial left ankle pain. EXAM: LEFT ANKLE COMPLETE - 3+ VIEW COMPARISON:  None. FINDINGS: Ankle is located without a fracture. However, there is an unusual configuration of the navicular bone which appears to be more medial than expected. Fracture in this area is not confidently identified. No significant soft tissue swelling at the ankle. IMPRESSION: No acute bone abnormality in left ankle. Unusual appearance of the midfoot and probably the navicular bone. Recommend dedicated foot images. Electronically Signed   By: Markus Daft M.D.   On: 08/19/2016 21:53    Procedures Procedures (including critical care time)  DIAGNOSTIC STUDIES: Oxygen Saturation is 98% on RA,  normal by my interpretation.    COORDINATION OF CARE: 10:13 PM Discussed treatment plan with pt at bedside and pt agreed to plan.  Medications Ordered in ED Medications - No data to display   Initial Impression / Assessment and Plan / ED Course  I have reviewed the triage vital signs and the nursing notes.  Pertinent labs & imaging results that were available during my care of the patient were reviewed by me and considered in my medical decision making (see chart for details).  Clinical Course   Patient has a left sprained ankle and foot. He will be treated with an ASO and crutches and Naprosyn and follow-up with PCP  Final Clinical Impressions(s) / ED Diagnoses   Final diagnoses:  None    New Prescriptions New Prescriptions   No medications on file     Milton Ferguson, MD 08/19/16 2324

## 2016-08-19 NOTE — ED Triage Notes (Signed)
Pt c/o left ankle pain after stepping wrong tonight.

## 2016-08-19 NOTE — ED Notes (Signed)
Pt c/o stepping wrong at home tonight, left foot/ankle pain, Dr Roderic Palau in prior to RN, see edp assessment for further,

## 2016-08-19 NOTE — Discharge Instructions (Signed)
Keep foot elevated. Follow-up with her family doctor next week

## 2017-01-11 ENCOUNTER — Ambulatory Visit (INDEPENDENT_AMBULATORY_CARE_PROVIDER_SITE_OTHER): Payer: BC Managed Care – PPO

## 2017-01-11 ENCOUNTER — Ambulatory Visit (INDEPENDENT_AMBULATORY_CARE_PROVIDER_SITE_OTHER): Payer: BC Managed Care – PPO | Admitting: Emergency Medicine

## 2017-01-11 VITALS — BP 116/78 | HR 79 | Temp 98.1°F | Resp 18 | Ht 73.0 in | Wt 211.0 lb

## 2017-01-11 DIAGNOSIS — J209 Acute bronchitis, unspecified: Secondary | ICD-10-CM

## 2017-01-11 DIAGNOSIS — R059 Cough, unspecified: Secondary | ICD-10-CM

## 2017-01-11 DIAGNOSIS — R05 Cough: Secondary | ICD-10-CM | POA: Diagnosis not present

## 2017-01-11 LAB — POCT CBC
Granulocyte percent: 70.2 %G (ref 37–80)
HCT, POC: 42.1 % — AB (ref 43.5–53.7)
HEMOGLOBIN: 14.8 g/dL (ref 14.1–18.1)
Lymph, poc: 2.2 (ref 0.6–3.4)
MCH: 31.2 pg (ref 27–31.2)
MCHC: 35.2 g/dL (ref 31.8–35.4)
MCV: 88.6 fL (ref 80–97)
MID (cbc): 0.4 (ref 0–0.9)
MPV: 6.2 fL (ref 0–99.8)
POC GRANULOCYTE: 6.1 (ref 2–6.9)
POC LYMPH PERCENT: 25.3 %L (ref 10–50)
POC MID %: 4.5 % (ref 0–12)
Platelet Count, POC: 298 10*3/uL (ref 142–424)
RBC: 4.75 M/uL (ref 4.69–6.13)
RDW, POC: 13.4 %
WBC: 8.7 10*3/uL (ref 4.6–10.2)

## 2017-01-11 MED ORDER — BENZONATATE 200 MG PO CAPS: 200.0000 mg | ORAL_CAPSULE | Freq: Two times a day (BID) | ORAL | 0 refills | Status: DC | PRN

## 2017-01-11 MED ORDER — HYDROCODONE-HOMATROPINE 5-1.5 MG/5ML PO SYRP: 5.0000 mL | ORAL_SOLUTION | Freq: Three times a day (TID) | ORAL | 0 refills | Status: DC | PRN

## 2017-01-11 MED ORDER — AZITHROMYCIN 250 MG PO TABS: ORAL_TABLET | ORAL | 0 refills | Status: DC

## 2017-01-11 NOTE — Progress Notes (Signed)
Patient ID: Daniel Gibbs, male   DOB: October 03, 1986, 31 y.o.   MRN: [id number] Daniel Gibbs 31 y.o.   Chief Complaint  Patient presents with  . Influenza    about 3 weeks ago   . Bronchitis    chest is sore     HISTORY OF PRESENT ILLNESS: This is a 32 y.o. male complaining of persistent cough x 2-3 weeks; took Tamiflu with no relief followed by Augmentin.  Cough  This is a new problem. The current episode started 1 to 4 weeks ago. The problem has been gradually worsening. The problem occurs every few minutes. The cough is productive of sputum. Associated symptoms include chest pain (soreness), chills, a fever, headaches, myalgias and nasal congestion. Pertinent negatives include no ear pain, eye redness, hemoptysis, rash, sore throat, weight loss or wheezing. There is no history of asthma, COPD, emphysema or pneumonia.     Prior to Admission medications   Medication Sig Start Date End Date Taking? Authorizing Provider  clonazePAM (KLONOPIN) 1 MG tablet Take 1 mg by mouth at bedtime.    Yes Historical Provider, MD  EPINEPHrine 0.3 mg/0.3 mL IJ SOAJ injection Inject into the muscle once.   Yes Historical Provider, MD  levocetirizine (XYZAL) 5 MG tablet Take 5 mg by mouth daily.   Yes Historical Provider, MD  rOPINIRole (REQUIP) 1 MG tablet Take 1 mg by mouth at bedtime. 07/24/16  Yes Historical Provider, MD  sertraline (ZOLOFT) 25 MG tablet Take 25 mg by mouth daily.   Yes Historical Provider, MD  valACYclovir (VALTREX) 1000 MG tablet Take 1 g by mouth 2 (two) times daily. 07/08/16  Yes Historical Provider, MD  amoxicillin-clavulanate (AUGMENTIN) 875-125 MG tablet Take 1 tablet by mouth 2 (two) times daily. 7 day course starting on 08/06/2016 08/06/16   Historical Provider, MD  naproxen (NAPROSYN) 500 MG tablet Take one every 12 hours for pain Patient not taking: Reported on 01/11/2017 08/19/16   Daniel Ferguson, MD    Allergies  Allergen Reactions  . Bee Venom Anaphylaxis  . Sulfate Hives      There are no active problems to display for this patient.   Past Medical History:  Diagnosis Date  . Anxiety     Past Surgical History:  Procedure Laterality Date  . APPENDECTOMY    . Surgery for urinary reflux     on left and right   . WRIST ARTHROSCOPY WITH FOVEAL TRIANGULAR FIBROCARTILAGE COMPLEX REPAIR Right 05/21/2015   Procedure: WRIST ARTHROSCOPY WITH TRIANGULAR FIBROCARTILAGE COMPLEX REPAIR;  Surgeon: Milly Jakob, MD;  Location: [facility];  Service: Orthopedics;  Laterality: Right;  RIGHT WRIST SCOPE WITH TFCC REPAIR.  GENERAL ANESTHESIA WITH PRE-OP BLOCK    Social History   Social History  . Marital status: Single    Spouse name: N/A  . Number of children: N/A  . Years of education: N/A   Occupational History  . Not on file.   Social History Main Topics  . Smoking status: Former Smoker    Types: Cigarettes  . Smokeless tobacco: Current User    Types: Chew  . Alcohol use Yes     Comment: occas  . Drug use: No  . Sexual activity: Yes   Other Topics Concern  . Not on file   Social History Narrative  . No narrative on file    No family history on file.   Review of Systems  Constitutional: Positive for chills and fever. Negative for weight loss.  HENT: Positive for congestion. Negative for ear pain and sore throat.   Eyes: Negative for discharge and redness.  Respiratory: Positive for cough. Negative for hemoptysis and wheezing.   Cardiovascular: Positive for chest pain (soreness). Negative for palpitations.  Gastrointestinal: Negative for abdominal pain, diarrhea, nausea and vomiting.  Genitourinary: Negative for dysuria and hematuria.  Musculoskeletal: Positive for myalgias.  Skin: Negative for rash.  Neurological: Positive for headaches.  Endo/Heme/Allergies: Does not bruise/bleed easily.  All other systems reviewed and are negative.  Vitals:   01/11/17 1520  BP: 116/78  Pulse: 79  Resp: 18  Temp: 98.1 F (36.7 C)      Physical Exam  Constitutional: He is oriented to person, place, and time. He appears well-developed and well-nourished.  HENT:  Head: Normocephalic and atraumatic.  Nose: Nose normal.  Mouth/Throat: Oropharynx is clear and moist. No oropharyngeal exudate.  Eyes: Conjunctivae and EOM are normal. Pupils are equal, round, and reactive to light.  Neck: Normal range of motion. Neck supple. No JVD present. No thyromegaly present.  Cardiovascular: Normal rate, regular rhythm and normal heart sounds.   Pulmonary/Chest: Effort normal and breath sounds normal.  Abdominal: Soft. Bowel sounds are normal. He exhibits no distension. There is no tenderness.  Musculoskeletal: Normal range of motion.  Lymphadenopathy:    He has no cervical adenopathy.  Neurological: He is alert and oriented to person, place, and time. No sensory deficit. He exhibits normal muscle tone.  Skin: Skin is warm and dry. Capillary refill takes less than 2 seconds.  Psychiatric: He has a normal mood and affect. His behavior is normal.  Vitals reviewed.  CXR: NAD CBC: unremarkable  ASSESSMENT & PLAN: Dione was seen today for influenza and bronchitis.  Diagnoses and all orders for this visit:  Acute bronchitis, unspecified organism -     DG Chest X-Ray (CXR) PA & lateral -     Comprehensive metabolic panel -     POCT CBC  Cough -     Comprehensive metabolic panel -     POCT CBC     Patient Instructions       IF you received an x-ray today, you will receive an invoice from [facility]. Please contact [facility] at [phone] with questions or concerns regarding your invoice.   IF you received labwork today, you will receive an invoice from [facility]. Please contact [facility] at [phone] with questions or concerns regarding your invoice.   Our billing staff will not be able to assist you with questions regarding bills from these companies.  You will be contacted with the lab  results as soon as they are available. The fastest way to get your results is to activate your My Chart account. Instructions are located on the last page of this paperwork. If you have not heard from [place] regarding the results in 2 weeks, please contact this office.     Acute Bronchitis, Adult Acute bronchitis is when air tubes (bronchi) in the lungs suddenly get swollen. The condition can make it hard to breathe. It can also cause these symptoms:  A cough.  Coughing up clear, yellow, or green mucus.  Wheezing.  Chest congestion.  Shortness of breath.  A fever.  Body aches.  Chills.  A sore throat. Follow these instructions at home: Medicines   Take over-the-counter and prescription medicines only as told by your doctor.  If you were prescribed an antibiotic medicine, take it as told by your doctor. Do not stop taking the antibiotic  even if you start to feel better. General instructions   Rest.  Drink enough fluids to keep your pee (urine) clear or pale yellow.  Avoid smoking and secondhand smoke. If you smoke and you need help quitting, ask your doctor. Quitting will help your lungs heal faster.  Use an inhaler, cool mist vaporizer, or humidifier as told by your doctor.  Keep all follow-up visits as told by your doctor. This is important. How is this prevented? To lower your risk of getting this condition again:  Wash your hands often with soap and water. If you cannot use soap and water, use hand sanitizer.  Avoid contact with people who have cold symptoms.  Try not to touch your hands to your mouth, nose, or eyes.  Make sure to get the flu shot every year. Contact a doctor if:  Your symptoms do not get better in 2 weeks. Get help right away if:  You cough up blood.  You have chest pain.  You have very bad shortness of breath.  You become dehydrated.  You faint (pass out) or keep feeling like you are going to pass out.  You keep throwing up  (vomiting).  You have a very bad headache.  Your fever or chills gets worse. This information is not intended to replace advice given to you by your health care provider. Make sure you discuss any questions you have with your health care provider. Document Released: 04/13/2008 Document Revised: 06/03/2016 Document Reviewed: 04/15/2016 Elsevier Interactive Patient Education  2017 Elsevier Inc.     Agustina Caroli, MD [facility]

## 2017-01-11 NOTE — Patient Instructions (Addendum)
     IF you received an x-ray today, you will receive an invoice from [facility]. Please contact [facility] at [phone] with questions or concerns regarding your invoice.   IF you received labwork today, you will receive an invoice from [facility]. Please contact [facility] at [phone] with questions or concerns regarding your invoice.   Our billing staff will not be able to assist you with questions regarding bills from these companies.  You will be contacted with the lab results as soon as they are available. The fastest way to get your results is to activate your My Chart account. Instructions are located on the last page of this paperwork. If you have not heard from us regarding the results in 2 weeks, please contact this office.      Acute Bronchitis, Adult Acute bronchitis is when air tubes (bronchi) in the lungs suddenly get swollen. The condition can make it hard to breathe. It can also cause these symptoms:  A cough.  Coughing up clear, yellow, or green mucus.  Wheezing.  Chest congestion.  Shortness of breath.  A fever.  Body aches.  Chills.  A sore throat.  Follow these instructions at home: Medicines  Take over-the-counter and prescription medicines only as told by your doctor.  If you were prescribed an antibiotic medicine, take it as told by your doctor. Do not stop taking the antibiotic even if you start to feel better. General instructions  Rest.  Drink enough fluids to keep your pee (urine) clear or pale yellow.  Avoid smoking and secondhand smoke. If you smoke and you need help quitting, ask your doctor. Quitting will help your lungs heal faster.  Use an inhaler, cool mist vaporizer, or humidifier as told by your doctor.  Keep all follow-up visits as told by your doctor. This is important. How is this prevented? To lower your risk of getting this condition again:  Wash your hands often with soap and water. If you cannot  use soap and water, use hand sanitizer.  Avoid contact with people who have cold symptoms.  Try not to touch your hands to your mouth, nose, or eyes.  Make sure to get the flu shot every year.  Contact a doctor if:  Your symptoms do not get better in 2 weeks. Get help right away if:  You cough up blood.  You have chest pain.  You have very bad shortness of breath.  You become dehydrated.  You faint (pass out) or keep feeling like you are going to pass out.  You keep throwing up (vomiting).  You have a very bad headache.  Your fever or chills gets worse. This information is not intended to replace advice given to you by your health care provider. Make sure you discuss any questions you have with your health care provider. Document Released: 04/13/2008 Document Revised: 06/03/2016 Document Reviewed: 04/15/2016 Elsevier Interactive Patient Education  2017 Elsevier Inc.  

## 2017-01-12 LAB — COMPREHENSIVE METABOLIC PANEL
ALK PHOS: 61 IU/L (ref 39–117)
ALT: 23 IU/L (ref 0–44)
AST: 19 IU/L (ref 0–40)
Albumin/Globulin Ratio: 1.5 (ref 1.2–2.2)
Albumin: 4.4 g/dL (ref 3.5–5.5)
BUN / CREAT RATIO: 14 (ref 9–20)
BUN: 13 mg/dL (ref 6–20)
Bilirubin Total: 0.3 mg/dL (ref 0.0–1.2)
CO2: 25 mmol/L (ref 18–29)
Calcium: 9.6 mg/dL (ref 8.7–10.2)
Chloride: 100 mmol/L (ref 96–106)
Creatinine, Ser: 0.93 mg/dL (ref 0.76–1.27)
GFR calc Af Amer: 124 mL/min/{1.73_m2} (ref >59)
GFR calc non Af Amer: 107 mL/min/{1.73_m2} (ref >59)
GLOBULIN, TOTAL: 2.9 g/dL (ref 1.5–4.5)
GLUCOSE: 96 mg/dL (ref 65–99)
Potassium: 4.4 mmol/L (ref 3.5–5.2)
Sodium: 140 mmol/L (ref 134–144)
Total Protein: 7.3 g/dL (ref 6.0–8.5)

## 2017-03-24 ENCOUNTER — Ambulatory Visit (INDEPENDENT_AMBULATORY_CARE_PROVIDER_SITE_OTHER): Payer: BC Managed Care – PPO | Admitting: Allergy & Immunology

## 2017-03-24 ENCOUNTER — Encounter: Payer: Self-pay | Admitting: Allergy & Immunology

## 2017-03-24 VITALS — BP 120/78 | HR 76 | Resp 20 | Ht 73.0 in | Wt 215.0 lb

## 2017-03-24 DIAGNOSIS — L409 Psoriasis, unspecified: Secondary | ICD-10-CM | POA: Diagnosis not present

## 2017-03-24 DIAGNOSIS — J301 Allergic rhinitis due to pollen: Secondary | ICD-10-CM | POA: Diagnosis not present

## 2017-03-24 MED ORDER — EPINEPHRINE 0.3 MG/0.3ML IJ SOAJ: 0.3000 mg | Freq: Once | INTRAMUSCULAR | 1 refills | Status: AC

## 2017-03-24 MED ORDER — CARBINOXAMINE MALEATE 6 MG PO TABS: 6.0000 mg | ORAL_TABLET | Freq: Three times a day (TID) | ORAL | 5 refills | Status: DC | PRN

## 2017-03-24 MED ORDER — AZELASTINE-FLUTICASONE 137-50 MCG/ACT NA SUSP: 2.0000 | Freq: Two times a day (BID) | NASAL | 5 refills | Status: DC

## 2017-03-24 NOTE — Patient Instructions (Addendum)
1. Chronic allergic rhinitis - Testing today showed: grasses, weeds, trees, molds, and cat - Avoidance measures provided. - Start the prednisone pack provided: prednisone 10mg  twice daily for four days and then 10mg  on the 5th day.  - Stop the Nasacort and start Dymista two sprays per nostril 1-2 times daily. - Stop the Xyzal and try using Ryvent 6mg  3-4 times daily as needed (samples provided). - We can send in a prescription if you think that it is working well. - Otherwise, restart your Xyzal daily. - Information on allergy shots provided. - Call your insurance company to make sure that you are ok with any required co-payments.  - Call us back when you make a decision.  2. Psoriasis - We will send in a prescription for triamcinolone ointment, which can be used twice daily as needed.  - Hopefully this will control the rash. - Otherwise we can refer you to dermatology for further management.   3. Return in about 3 months (around 06/24/2017).  Please inform us of any Emergency Department visits, hospitalizations, or changes in symptoms. Call us before going to the ED for breathing or allergy symptoms since we might be able to fit you in for a sick visit. Feel free to contact us anytime with any questions, problems, or concerns.  It was a pleasure to meet you and your family today! Happy spring!   Websites that have reliable patient information: 1. American Academy of Asthma, Allergy, and Immunology: www.aaaai.org 2. Food Allergy Research and Education (FARE): foodallergy.org 3. Mothers of Asthmatics: http://www.asthmacommunitynetwork.org 4. American College of Allergy, Asthma, and Immunology: www.acaai.org  Reducing Pollen Exposure  The American Academy of Allergy, Asthma and Immunology suggests the following steps to reduce your exposure to pollen during allergy seasons.    1. Do not hang sheets or clothing out to dry; pollen may collect on these items. 2. Do not mow lawns or spend  time around freshly cut grass; mowing stirs up pollen. 3. Keep windows closed at night.  Keep car windows closed while driving. 4. Minimize morning activities outdoors, a time when pollen counts are usually at their highest. 5. Stay indoors as much as possible when pollen counts or humidity is high and on windy days when pollen tends to remain in the air longer. 6. Use air conditioning when possible.  Many air conditioners have filters that trap the pollen spores. 7. Use a HEPA room air filter to remove pollen form the indoor air you breathe.  Control of Mold Allergen  Mold and fungi can grow on a variety of surfaces provided certain temperature and moisture conditions exist.  Outdoor molds grow on plants, decaying vegetation and soil.  The major outdoor mold, Alternaria and Cladosporium, are found in very high numbers during hot and dry conditions.  Generally, a late Summer - Fall peak is seen for common outdoor fungal spores.  Rain will temporarily lower outdoor mold spore count, but counts rise rapidly when the rainy period ends.  The most important indoor molds are Aspergillus and Penicillium.  Dark, humid and poorly ventilated basements are ideal sites for mold growth.  The next most common sites of mold growth are the bathroom and the kitchen.  Outdoor [employer] 1. Use air conditioning and keep windows closed 2. Avoid exposure to decaying vegetation. 3. Avoid leaf raking. 4. Avoid grain handling. 5. Consider wearing a face mask if working in moldy areas.  Indoor Mold Control 1. Maintain humidity below 50%. 2. Clean washable surfaces with  5% bleach solution. 3. Remove sources e.g. contaminated carpets.  Control of Dog or Cat Allergen  Avoidance is the best way to manage a dog or cat allergy. If you have a dog or cat and are allergic to dog or cats, consider removing the dog or cat from the home. If you have a dog or cat but don't want to find it a new home, or if your family wants a  pet even though someone in the household is allergic, here are some strategies that may help keep symptoms at bay:  1. Keep the pet out of your bedroom and restrict it to only a few rooms. Be advised that keeping the dog or cat in only one room will not limit the allergens to that room. 2. Don't pet, hug or kiss the dog or cat; if you do, wash your hands with soap and water. 3. High-efficiency particulate air (HEPA) cleaners run continuously in a bedroom or living room can reduce allergen levels over time. 4. Regular use of a high-efficiency vacuum cleaner or a central vacuum can reduce allergen levels. 5. Giving your dog or cat a bath at least once a week can reduce airborne allergen.

## 2017-03-24 NOTE — Progress Notes (Signed)
NEW PATIENT  Date of Service/Encounter:  03/24/17  Referring provider: Patient, No Pcp Per   Assessment:   Non-seasonal allergic rhinitis (grasses, weeds, trees, molds, cat)  Psoriasis   Plan/Recommendations:   1. Chronic allergic rhinitis - Testing today showed: grasses, weeds, trees, molds, and cat - Avoidance measures provided. - Start the prednisone pack provided: prednisone 10mg  twice daily for four days and then 10mg  on the 5th day.  - Stop the Nasacort and start Dymista two sprays per nostril 1-2 times daily. - Stop the Xyzal and try using Ryvent 6mg  3-4 times daily as needed (samples provided). - We can send in a prescription if you think that it is working well. - Otherwise, restart your Xyzal daily. - Information on allergy shots provided. - Call your insurance company to make sure that you are ok with any required co-payments.  - Call us back when you make a decision.  2. Psoriasis - We will send in a prescription for triamcinolone ointment, which can be used twice daily as needed.  - Hopefully this will control the rash. - Otherwise we can refer you to dermatology for further management.   3. Return in about 3 months (around 06/24/2017).   Subjective:   Daniel Gibbs is a 31 y.o. male presenting today for evaluation of  Chief Complaint  Patient presents with  . New Patient (Initial Visit)  . Allergies    Daniel Gibbs has a history of the following: Patient Active Problem List   Diagnosis Date Noted  . Acute bronchitis 01/11/2017  . Cough 01/11/2017    History obtained from: chart review and patient and his wife (they got married in October 2017).  Daniel Gibbs was referred by Patient, No Pcp Per.     Daniel Gibbs is a 31 y.o. male presenting for an allergy evaluation. He reports that he has problems when he is at soccer practice and generally anytime that he is outdoors. He gets constant sneezing, eye puffing, and itching. He stopped the Xyzal and  he developed a rash on both elbows. He is Nasacort which does not work at all. They have tried a multitude of medications without relief. He first started having symptoms when he was in his mid to late 104s. When it starts getting cold, the symptoms improve. Winter leads to complete resolution. He does use a Secondary school teacher. He grew up in [place] and did no have problems there. He moved here in 1998.    [place] has never needed an inhaler aside from one episode when he was diagnosed with bronchitis. He does have problems with shortness of breath with physical activity. However, it should be noted that when he is traveling and doing hiking he has no problems. He typically only has problems when he is outdoors around his home and only during the growing season. Otherwise, there is no history of other atopic diseases, including asthma, drug allergies, food allergies, stinging insect allergies, or urticaria. There is no significant infectious history. Vaccinations are up to date.    Past Medical History: Patient Active Problem List   Diagnosis Date Noted  . Acute bronchitis 01/11/2017  . Cough 01/11/2017    Medication List:  Allergies as of 03/24/2017      Reactions   Bee Venom Anaphylaxis   Sulfate Hives      Medication List       Accurate as of 03/24/17  4:52 PM. Always use your most recent med list.  clonazePAM 1 MG tablet Commonly known as:  KLONOPIN Take 1 mg by mouth at bedtime.   HYDROcodone-homatropine 5-1.5 MG/5ML syrup Commonly known as:  HYCODAN Take 5 mLs by mouth every 8 (eight) hours as needed for cough.   levocetirizine 5 MG tablet Commonly known as:  XYZAL Take 5 mg by mouth daily.   rOPINIRole 1 MG tablet Commonly known as:  REQUIP Take 1 mg by mouth at bedtime.   sertraline 25 MG tablet Commonly known as:  ZOLOFT Take 25 mg by mouth daily.   valACYclovir 1000 MG tablet Commonly known as:  VALTREX Take 1 g by mouth 2 (two) times daily.       Birth  History: non-contributory.  Developmental History: non-contributory.   Past Surgical History: Past Surgical History:  Procedure Laterality Date  . APPENDECTOMY    . Surgery for urinary reflux     on left and right   . WRIST ARTHROSCOPY WITH FOVEAL TRIANGULAR FIBROCARTILAGE COMPLEX REPAIR Right 05/21/2015   Procedure: WRIST ARTHROSCOPY WITH TRIANGULAR FIBROCARTILAGE COMPLEX REPAIR;  Surgeon: Milly Jakob, MD;  Location: [facility];  Service: Orthopedics;  Laterality: Right;  RIGHT WRIST SCOPE WITH TFCC REPAIR.  GENERAL ANESTHESIA WITH PRE-OP BLOCK     Family History: Family History  Problem Relation Age of Onset  . Asthma Brother      Social History: Daniel Gibbs lives at home with his wife. They got married in October 2017. They live in a house that is 33 years old. There is hardwood flooring throughout. There is no mildew or roach problems. They have gas and electric heating as well as central cooling. There are 2 indoor cats, which do not seem to bother his symptoms. He does have dust mite coverings on the bed, but not the pillows. There is no tobacco exposure currently. He smoked he does currently use chewing tobacco since June 2014. 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     Review of Systems: a 14-point review of systems is pertinent for what is mentioned in HPI.  Otherwise, all other systems were negative. Constitutional: negative other than that listed in the HPI Eyes: negative other than that listed in the HPI Ears, nose, mouth, throat, and face: negative other than that listed in the HPI Respiratory: negative other than that listed in the HPI Cardiovascular: negative other than that listed in the HPI Gastrointestinal: negative other than that listed in the HPI Genitourinary: negative other than that listed in the HPI Integument: negative other than that listed in the HPI Hematologic: negative other  than that listed in the HPI Musculoskeletal: negative other than that listed in the HPI Neurological: negative other than that listed in the HPI Allergy/Immunologic: negative other than that listed in the HPI    Objective:   Blood pressure 120/78, pulse 76, resp. rate 20, height 6\' 1"  (1.854 m), weight 215 lb (97.5 kg). Body mass index is 28.37 kg/m.   Physical Exam:  General: Alert, interactive, in no acute distress. Adenoidal facies.  Eyes: Conjunctival injection on the right with limbal sparing, Conjunctival injection on the left with limbal sparing, PERRL bilaterally, No discharge on the right, No discharge on the left, No Horner-Trantas dots present and allergic shiners present bilaterally Ears: Right TM pearly gray with normal light reflex, Left TM pearly gray with normal light reflex, Right TM intact without perforation and Left TM intact without perforation.  Nose/Throat: External nose within normal  limits, nasal crease present and septum midline, turbinates edematous and pale with clear discharge, post-pharynx erythematous with cobblestoning in the posterior oropharynx. Tonsils 2+ without exudates Neck: Supple without thyromegaly.  Adenopathy: no enlarged lymph nodes appreciated in the anterior cervical, occipital, axillary, epitrochlear, inguinal, or popliteal regions Lungs: Clear to auscultation without wheezing, rhonchi or rales. No increased work of breathing. CV: Normal S1/S2, no murmurs. Capillary refill <2 seconds.  Abdomen: Nondistended, nontender. No guarding or rebound tenderness. Bowel sounds present in all fields and hyperactive  Skin: Warm and dry, without lesions or rashes. Extremities:  No clubbing, cyanosis or edema. Neuro:   Grossly intact. No focal deficits appreciated. Responsive to questions.  Diagnostic studies:   Allergy Studies:   Indoor/Outdoor Percutaneous Adult Environmental Panel: positive to bahia grass, [place] grass, johnson grass, [place] blue  grass, meadow fescue grass, perennial rye grass, sweet vernal grass, timothy grass, cocklebur, burweed marsh elder, short ragweed, giant ragweed, English plantain, rough marsh elder, common mugwort, birch, American beech, hickory, maple, oak, pecan pollen, black walnut pollen and cockroach. Otherwise negative with adequate controls.  Indoor/Outdoor Selected Intradermal Environmental Panel: positive to mold mix #3, mold mix #4 and cat. Otherwise negative with adequate controls.    Salvatore Marvel, MD [facility]

## 2017-04-01 DIAGNOSIS — J301 Allergic rhinitis due to pollen: Secondary | ICD-10-CM | POA: Diagnosis not present

## 2017-04-02 DIAGNOSIS — J3089 Other allergic rhinitis: Secondary | ICD-10-CM | POA: Diagnosis not present

## 2017-04-20 ENCOUNTER — Ambulatory Visit (INDEPENDENT_AMBULATORY_CARE_PROVIDER_SITE_OTHER): Payer: BC Managed Care – PPO | Admitting: *Deleted

## 2017-04-20 DIAGNOSIS — J309 Allergic rhinitis, unspecified: Secondary | ICD-10-CM | POA: Diagnosis not present

## 2017-04-22 NOTE — Progress Notes (Addendum)
Immunotherapy   Patient Details  Name: Daniel Gibbs MRN: [id number] Date of Birth: 1985-12-08  04/20/17  [name]:Patient started Allergy Injections.  Pollens-Cat and Mold-CR.  Blue Vial 1:100,000 and 0.05 ml of each vial given. Following schedule: B  Frequency: 1-2 times weekly Epi-Pen: Patient will be given a sample of Auvi-Q in [place] on 04/27/17 due to patient having issues with his insurance. Consent signed and patient instructions given. Patient waited 60 minutes after injections given and no reaction noted.   Maree Erie 04/22/2017, 5:14 PM

## 2017-04-27 ENCOUNTER — Ambulatory Visit (INDEPENDENT_AMBULATORY_CARE_PROVIDER_SITE_OTHER): Payer: BC Managed Care – PPO | Admitting: *Deleted

## 2017-04-27 DIAGNOSIS — J309 Allergic rhinitis, unspecified: Secondary | ICD-10-CM | POA: Diagnosis not present

## 2017-04-28 ENCOUNTER — Telehealth: Payer: Self-pay | Admitting: *Deleted

## 2017-04-28 NOTE — Telephone Encounter (Signed)
Called patient to follow up from allergy injection he received 04/27/17.  Patient reports some itching and less than dime size local reaction.  Patient is applying hydrocortisone cream and taking antihistamine.  Patient was instructed to contact the office if he worsens in any way.

## 2017-04-28 NOTE — Progress Notes (Signed)
NEW LABELS 1/MILLION.  JM

## 2017-05-04 ENCOUNTER — Ambulatory Visit (INDEPENDENT_AMBULATORY_CARE_PROVIDER_SITE_OTHER): Payer: BC Managed Care – PPO | Admitting: *Deleted

## 2017-05-04 DIAGNOSIS — J309 Allergic rhinitis, unspecified: Secondary | ICD-10-CM

## 2017-05-11 ENCOUNTER — Ambulatory Visit (INDEPENDENT_AMBULATORY_CARE_PROVIDER_SITE_OTHER): Payer: BC Managed Care – PPO | Admitting: *Deleted

## 2017-05-11 DIAGNOSIS — J309 Allergic rhinitis, unspecified: Secondary | ICD-10-CM

## 2017-05-18 ENCOUNTER — Ambulatory Visit (INDEPENDENT_AMBULATORY_CARE_PROVIDER_SITE_OTHER): Payer: BC Managed Care – PPO | Admitting: *Deleted

## 2017-05-18 DIAGNOSIS — J309 Allergic rhinitis, unspecified: Secondary | ICD-10-CM

## 2017-06-08 ENCOUNTER — Ambulatory Visit (INDEPENDENT_AMBULATORY_CARE_PROVIDER_SITE_OTHER): Payer: BC Managed Care – PPO

## 2017-06-08 DIAGNOSIS — J309 Allergic rhinitis, unspecified: Secondary | ICD-10-CM

## 2017-06-22 ENCOUNTER — Ambulatory Visit (INDEPENDENT_AMBULATORY_CARE_PROVIDER_SITE_OTHER): Payer: BC Managed Care – PPO | Admitting: *Deleted

## 2017-06-22 DIAGNOSIS — J309 Allergic rhinitis, unspecified: Secondary | ICD-10-CM | POA: Diagnosis not present

## 2017-06-29 ENCOUNTER — Ambulatory Visit (INDEPENDENT_AMBULATORY_CARE_PROVIDER_SITE_OTHER): Payer: BC Managed Care – PPO | Admitting: *Deleted

## 2017-06-29 DIAGNOSIS — J309 Allergic rhinitis, unspecified: Secondary | ICD-10-CM

## 2017-07-06 ENCOUNTER — Ambulatory Visit: Payer: BC Managed Care – PPO | Admitting: Allergy & Immunology

## 2017-07-06 ENCOUNTER — Encounter: Payer: Self-pay | Admitting: Allergy & Immunology

## 2017-07-06 ENCOUNTER — Ambulatory Visit (INDEPENDENT_AMBULATORY_CARE_PROVIDER_SITE_OTHER): Payer: BC Managed Care – PPO | Admitting: Allergy & Immunology

## 2017-07-06 VITALS — BP 116/76 | HR 68 | Resp 16

## 2017-07-06 DIAGNOSIS — J309 Allergic rhinitis, unspecified: Secondary | ICD-10-CM | POA: Diagnosis not present

## 2017-07-06 DIAGNOSIS — L409 Psoriasis, unspecified: Secondary | ICD-10-CM | POA: Diagnosis not present

## 2017-07-06 DIAGNOSIS — J3089 Other allergic rhinitis: Secondary | ICD-10-CM

## 2017-07-06 DIAGNOSIS — J302 Other seasonal allergic rhinitis: Secondary | ICD-10-CM | POA: Diagnosis not present

## 2017-07-06 MED ORDER — CARBINOXAMINE MALEATE 6 MG PO TABS: 6.0000 mg | ORAL_TABLET | Freq: Three times a day (TID) | ORAL | 5 refills | Status: DC | PRN

## 2017-07-06 NOTE — Progress Notes (Signed)
FOLLOW UP  Date of Service/Encounter:  07/06/17   Assessment:   Seasonal and perennial allergic rhinitis  Psoriasis  Plan/Recommendations:   1. Chronic allergic rhinitis (grasses, weeds, trees, molds, and cat) - Continue with shots at the same schedule. - Continue with Ryvent 6mg  3-4 times daily as needed. - Continue with Dymista two sprays per nostril 1-2 times daily. - Refills sent in.   2. Psoriasis - Continue with triamcinolone ointment, which can be used twice daily as needed.  - We can refer you to dermatology for further management.   3. Return in about 1 year (around 07/06/2018).   Subjective:   Daniel Gibbs is a 31 y.o. male presenting today for follow up of  Chief Complaint  Patient presents with  . Allergic Rhinitis     doing well     Daniel Gibbs has a history of the following: Patient Active Problem List   Diagnosis Date Noted  . Seasonal and perennial allergic rhinitis 07/06/2017  . Psoriasis 07/06/2017  . Acute bronchitis 01/11/2017  . Cough 01/11/2017    History obtained from: chart review and patient.  [facility] Primary Care Provider is [facility].     Daniel Gibbs is a 31 y.o. male presenting for a follow up visit. He was last seen in May 2018 for his first appointment. At that time, he was endorsing symptoms of allergic rhinitis that have not been well controlled with a variety of medications including antihistamine and nasal steroids. We did testing and he was positive to grasses, weeds, trees, molds, and cat. He was interested in starting immunotherapy, and he has since started it. He also had a history of psoriasis and we gave him a prescription for triamcinolone ointment to control it. We changed him to Dymista two sprays per nostril daily. We also started him on Ryvent.   Since the last visit, he has done well. He did have a large delayed reaction following the first injection. Therefore we decreased him to the Silver  Vial which he has tolerated well. He is quite happy with the Ryvent and is singing praises today. He did have to go around the system to get the Ryvent at the cheaper price. A three monthly supply was originally going to cost him $150 and a two month supply was going to cost him $90. However, he was able to get 30 pills for $14, and he can come back daily to get more refills. This has seemed to work well. Symptoms were controlled with one pill in the morning but he did have to add an afternoon dose when the ragweed started.   His skin is well controlled with the triamcinolone ointment as needed. The lesions on his elbows have improved and cleared up remarkably well. 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 Otherwise, there have been no changes to his past medical history, surgical history, family history, or social history.    Review of Systems: a 14-point review of systems is pertinent for what is mentioned in HPI.  Otherwise, all other systems were negative. Constitutional: negative other than that listed in the HPI Eyes: negative other than that listed in the HPI Ears, nose, mouth, throat, and face: negative other than that listed in the HPI Respiratory: negative other than that listed in the HPI Cardiovascular: negative other than that listed in the HPI Gastrointestinal: negative other than that listed in  the HPI Genitourinary: negative other than that listed in the HPI Integument: negative other than that listed in the HPI Hematologic: negative other than that listed in the HPI Musculoskeletal: negative other than that listed in the HPI Neurological: negative other than that listed in the HPI Allergy/Immunologic: negative other than that listed in the HPI    Objective:   Blood pressure 116/76, pulse 68, resp. rate 16. There is no height or weight on file to calculate BMI.   Physical Exam:  General:  Alert, interactive, in no acute distress. Pleasant male and very talkative.  Eyes: No conjunctival injection present on the right, No conjunctival injection present on the left, PERRL bilaterally, No discharge on the right, No discharge on the left and No Horner-Trantas dots present Ears: Right TM pearly gray with normal light reflex, Left TM pearly gray with normal light reflex, Right TM intact without perforation and Left TM intact without perforation.  Nose/Throat: External nose within normal limits, nasal crease present and septum midline, turbinates markedly edematous with clear discharge, post-pharynx erythematous with cobblestoning in the posterior oropharynx. Tonsils 2+ without exudates Neck: Supple without thyromegaly. Lungs: Clear to auscultation without wheezing, rhonchi or rales. No increased work of breathing. CV: Normal S1/S2, no murmurs. Capillary refill <2 seconds.  Skin: Warm and dry, without lesions or rashes. Psoriatic lesions on his elbows still erythematous but improved.  Neuro:   Grossly intact. No focal deficits appreciated. Responsive to questions.   Diagnostic studies: none     Daniel Marvel, MD [facility]

## 2017-07-06 NOTE — Patient Instructions (Addendum)
1. Chronic allergic rhinitis (grasses, weeds, trees, molds, and cat) - Continue with shots at the same schedule. - Continue with Ryvent 6mg  3-4 times daily as needed. - Continue with Dymista two sprays per nostril 1-2 times daily.  2. Psoriasis - Continue with triamcinolone ointment, which can be used twice daily as needed.  - We can refer you to dermatology for further management.   3. Return in about 1 year (around 07/06/2018).  Please inform us of any Emergency Department visits, hospitalizations, or changes in symptoms. Call us before going to the ED for breathing or allergy symptoms since we might be able to fit you in for a sick visit. Feel free to contact us anytime with any questions, problems, or concerns.  It was a pleasure to see you again today!   Websites that have reliable patient information: 1. American Academy of Asthma, Allergy, and Immunology: www.aaaai.org 2. Food Allergy Research and Education (FARE): foodallergy.org 3. Mothers of Asthmatics: http://www.asthmacommunitynetwork.org 4. American College of Allergy, Asthma, and Immunology: www.acaai.org

## 2017-07-13 ENCOUNTER — Ambulatory Visit (INDEPENDENT_AMBULATORY_CARE_PROVIDER_SITE_OTHER): Payer: BC Managed Care – PPO | Admitting: *Deleted

## 2017-07-13 DIAGNOSIS — J309 Allergic rhinitis, unspecified: Secondary | ICD-10-CM

## 2017-07-20 ENCOUNTER — Ambulatory Visit (INDEPENDENT_AMBULATORY_CARE_PROVIDER_SITE_OTHER): Payer: BC Managed Care – PPO | Admitting: *Deleted

## 2017-07-20 DIAGNOSIS — J309 Allergic rhinitis, unspecified: Secondary | ICD-10-CM | POA: Diagnosis not present

## 2017-07-27 ENCOUNTER — Ambulatory Visit (INDEPENDENT_AMBULATORY_CARE_PROVIDER_SITE_OTHER): Payer: BC Managed Care – PPO | Admitting: *Deleted

## 2017-07-27 DIAGNOSIS — J309 Allergic rhinitis, unspecified: Secondary | ICD-10-CM | POA: Diagnosis not present

## 2017-08-10 ENCOUNTER — Ambulatory Visit (INDEPENDENT_AMBULATORY_CARE_PROVIDER_SITE_OTHER): Payer: BC Managed Care – PPO

## 2017-08-10 DIAGNOSIS — J309 Allergic rhinitis, unspecified: Secondary | ICD-10-CM | POA: Diagnosis not present

## 2017-08-17 ENCOUNTER — Ambulatory Visit (INDEPENDENT_AMBULATORY_CARE_PROVIDER_SITE_OTHER): Payer: BC Managed Care – PPO

## 2017-08-17 DIAGNOSIS — J309 Allergic rhinitis, unspecified: Secondary | ICD-10-CM

## 2017-08-24 ENCOUNTER — Ambulatory Visit (INDEPENDENT_AMBULATORY_CARE_PROVIDER_SITE_OTHER): Payer: BC Managed Care – PPO | Admitting: *Deleted

## 2017-08-24 DIAGNOSIS — J309 Allergic rhinitis, unspecified: Secondary | ICD-10-CM

## 2017-08-31 ENCOUNTER — Ambulatory Visit (INDEPENDENT_AMBULATORY_CARE_PROVIDER_SITE_OTHER): Payer: BC Managed Care – PPO

## 2017-08-31 DIAGNOSIS — J309 Allergic rhinitis, unspecified: Secondary | ICD-10-CM

## 2017-09-07 ENCOUNTER — Ambulatory Visit (INDEPENDENT_AMBULATORY_CARE_PROVIDER_SITE_OTHER): Payer: BC Managed Care – PPO | Admitting: *Deleted

## 2017-09-07 DIAGNOSIS — J309 Allergic rhinitis, unspecified: Secondary | ICD-10-CM

## 2017-09-14 ENCOUNTER — Ambulatory Visit (INDEPENDENT_AMBULATORY_CARE_PROVIDER_SITE_OTHER): Payer: BC Managed Care – PPO | Admitting: *Deleted

## 2017-09-14 DIAGNOSIS — J309 Allergic rhinitis, unspecified: Secondary | ICD-10-CM

## 2017-09-21 ENCOUNTER — Ambulatory Visit (INDEPENDENT_AMBULATORY_CARE_PROVIDER_SITE_OTHER): Payer: BC Managed Care – PPO | Admitting: *Deleted

## 2017-09-21 DIAGNOSIS — J309 Allergic rhinitis, unspecified: Secondary | ICD-10-CM

## 2017-09-28 ENCOUNTER — Ambulatory Visit (INDEPENDENT_AMBULATORY_CARE_PROVIDER_SITE_OTHER): Payer: BC Managed Care – PPO | Admitting: *Deleted

## 2017-09-28 DIAGNOSIS — J309 Allergic rhinitis, unspecified: Secondary | ICD-10-CM

## 2017-10-05 ENCOUNTER — Ambulatory Visit (INDEPENDENT_AMBULATORY_CARE_PROVIDER_SITE_OTHER): Payer: BC Managed Care – PPO | Admitting: *Deleted

## 2017-10-05 DIAGNOSIS — J309 Allergic rhinitis, unspecified: Secondary | ICD-10-CM

## 2017-10-05 IMAGING — CR DG FOOT COMPLETE 3+V*L*
3 series · 3 of 3 positions shown · non-contrast
Comparison: None.

CLINICAL DATA: Acute onset of left foot pain after falling down
flight of stairs. Initial encounter.

EXAM:
LEFT FOOT - COMPLETE 3+ VIEW

[ap]
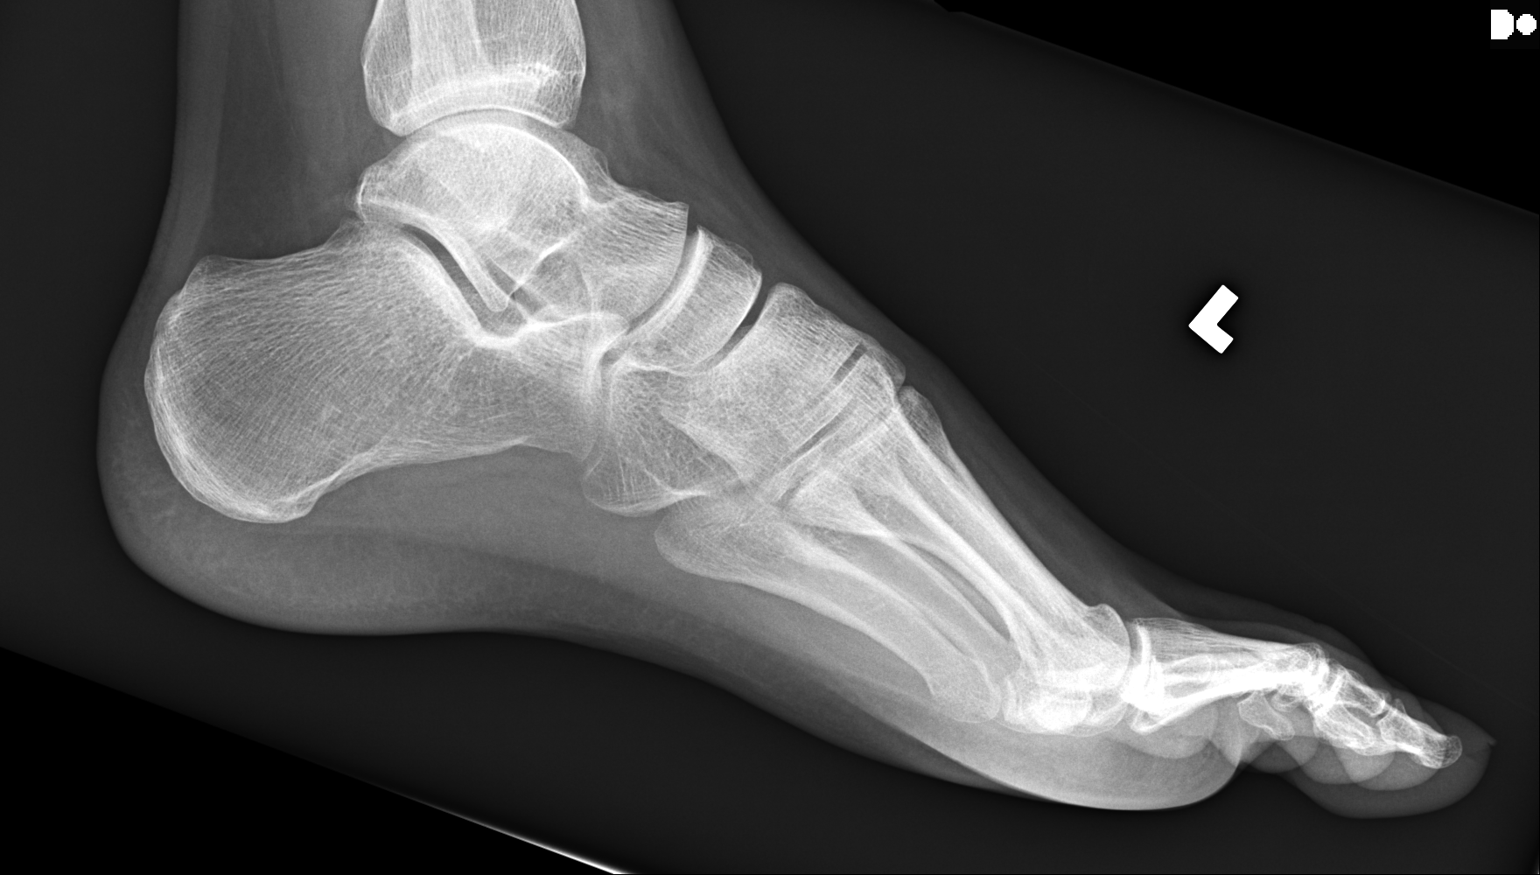

[oblique]
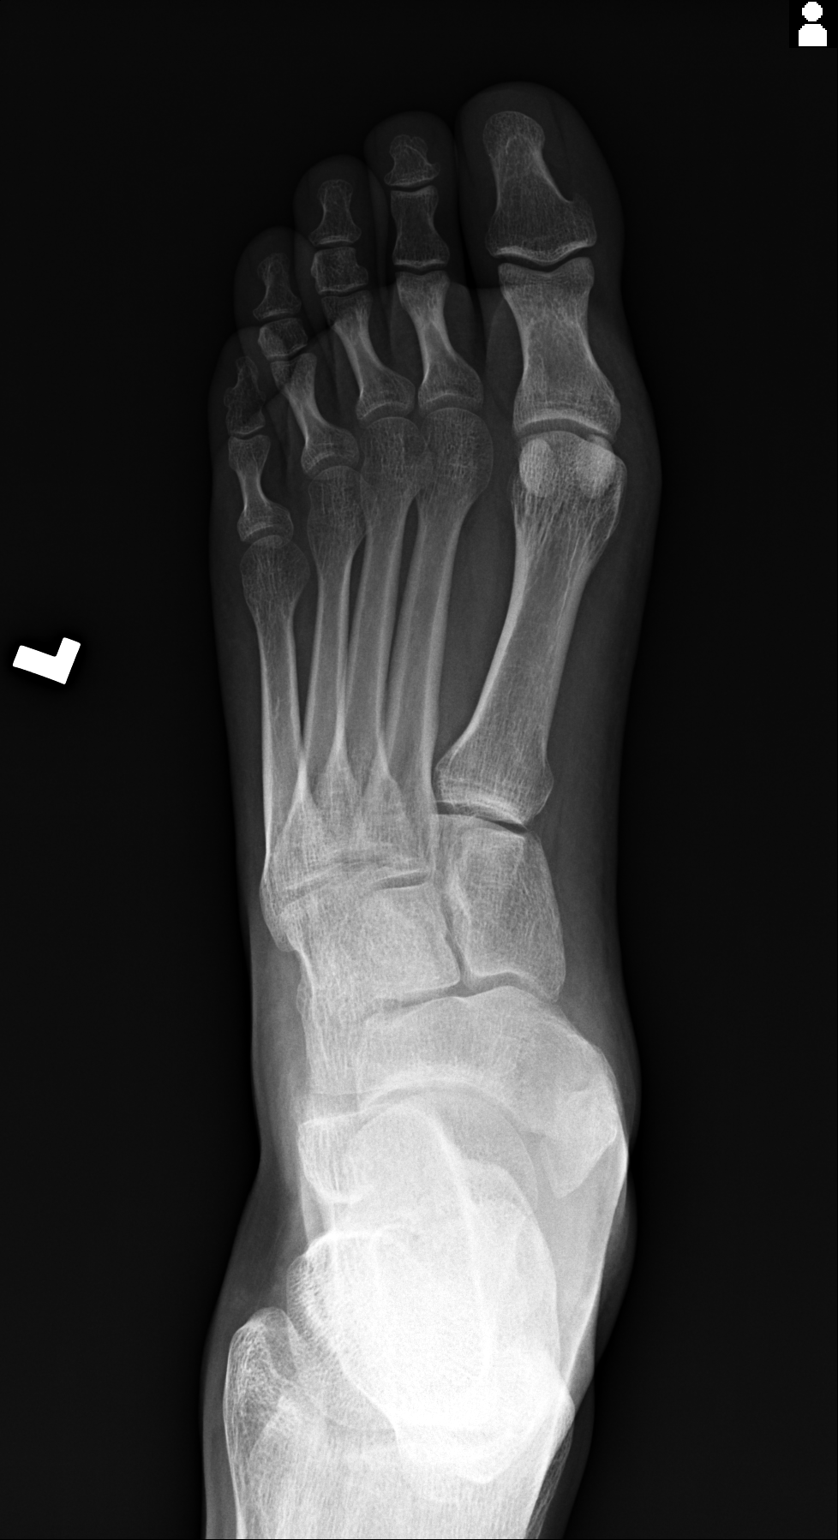

[lat]
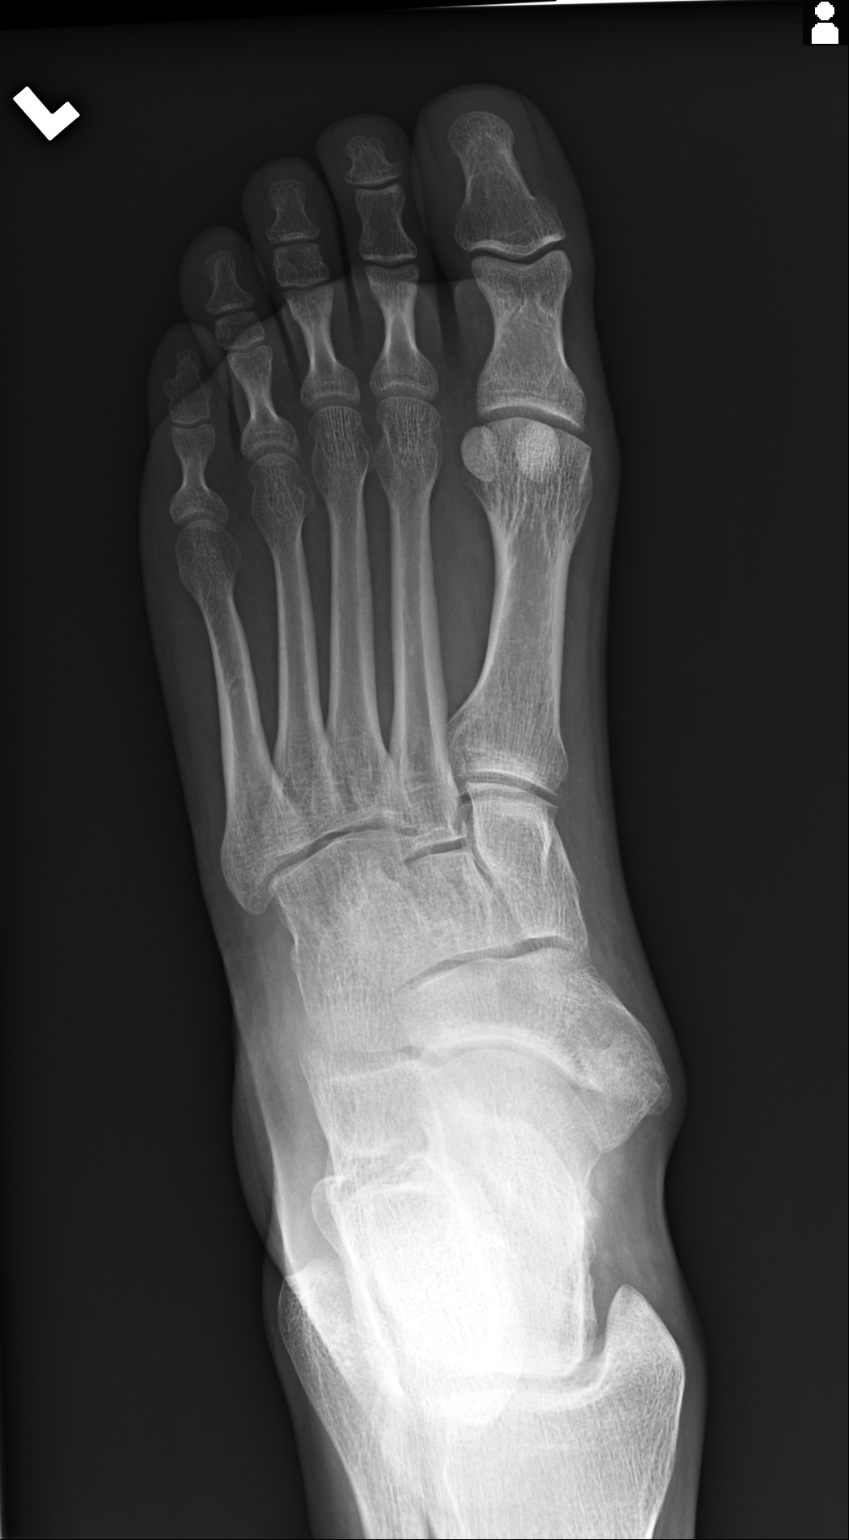

[3 of 3 positions shown; findings below may reference images not displayed]

FINDINGS: There is no evidence of acute fracture or dislocation. A large
medial extension of the navicular is noted, which may reflect
remodeling after the patient's remote left foot fracture.

The joint spaces are preserved. There is no evidence of talar
subluxation; the subtalar joint is unremarkable in appearance.

No significant soft tissue abnormalities are seen.
IMPRESSION: Large medial extension of the navicular may reflect remodeling after
the patient's femoral left foot fracture. No evidence of acute
fracture or dislocation.

## 2017-10-05 IMAGING — DX DG ANKLE COMPLETE 3+V*L*
3 series · 3 of 3 positions shown · non-contrast
Comparison: None.

CLINICAL DATA: Medial left ankle pain.

EXAM:
LEFT ANKLE COMPLETE - 3+ VIEW

[ankle ap]
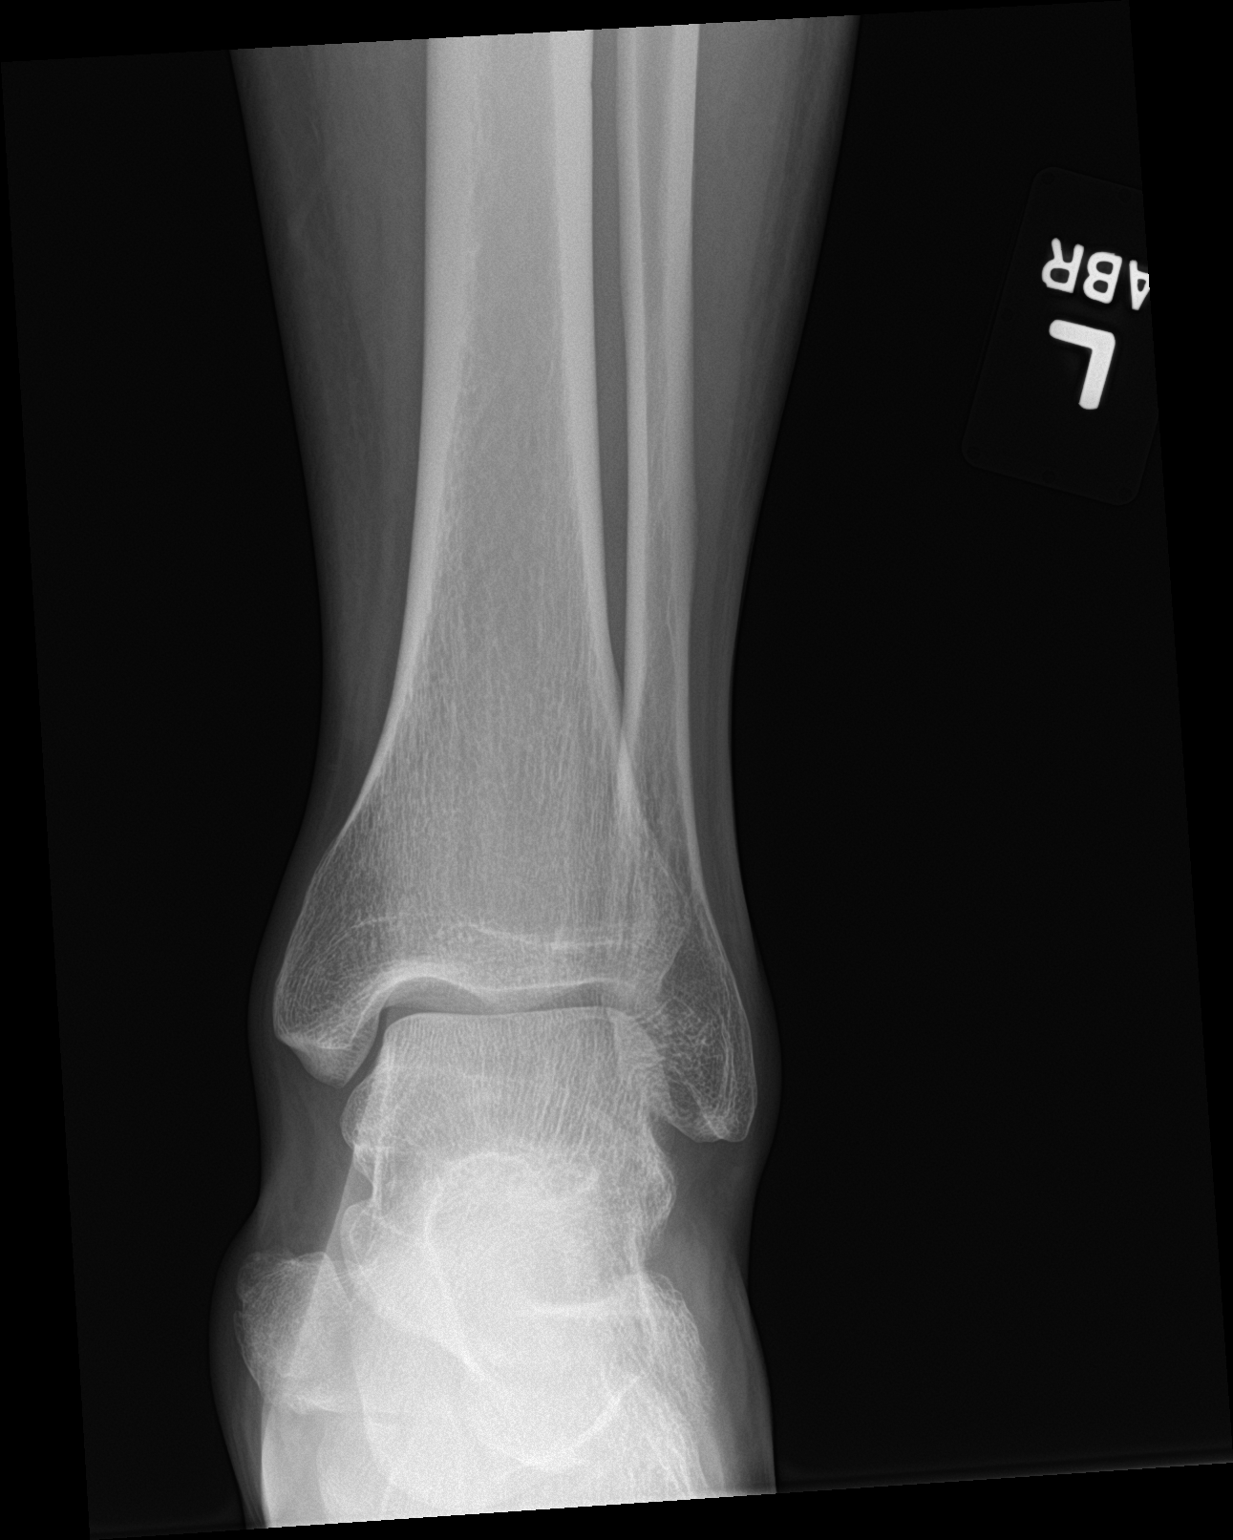

[ankle obl]
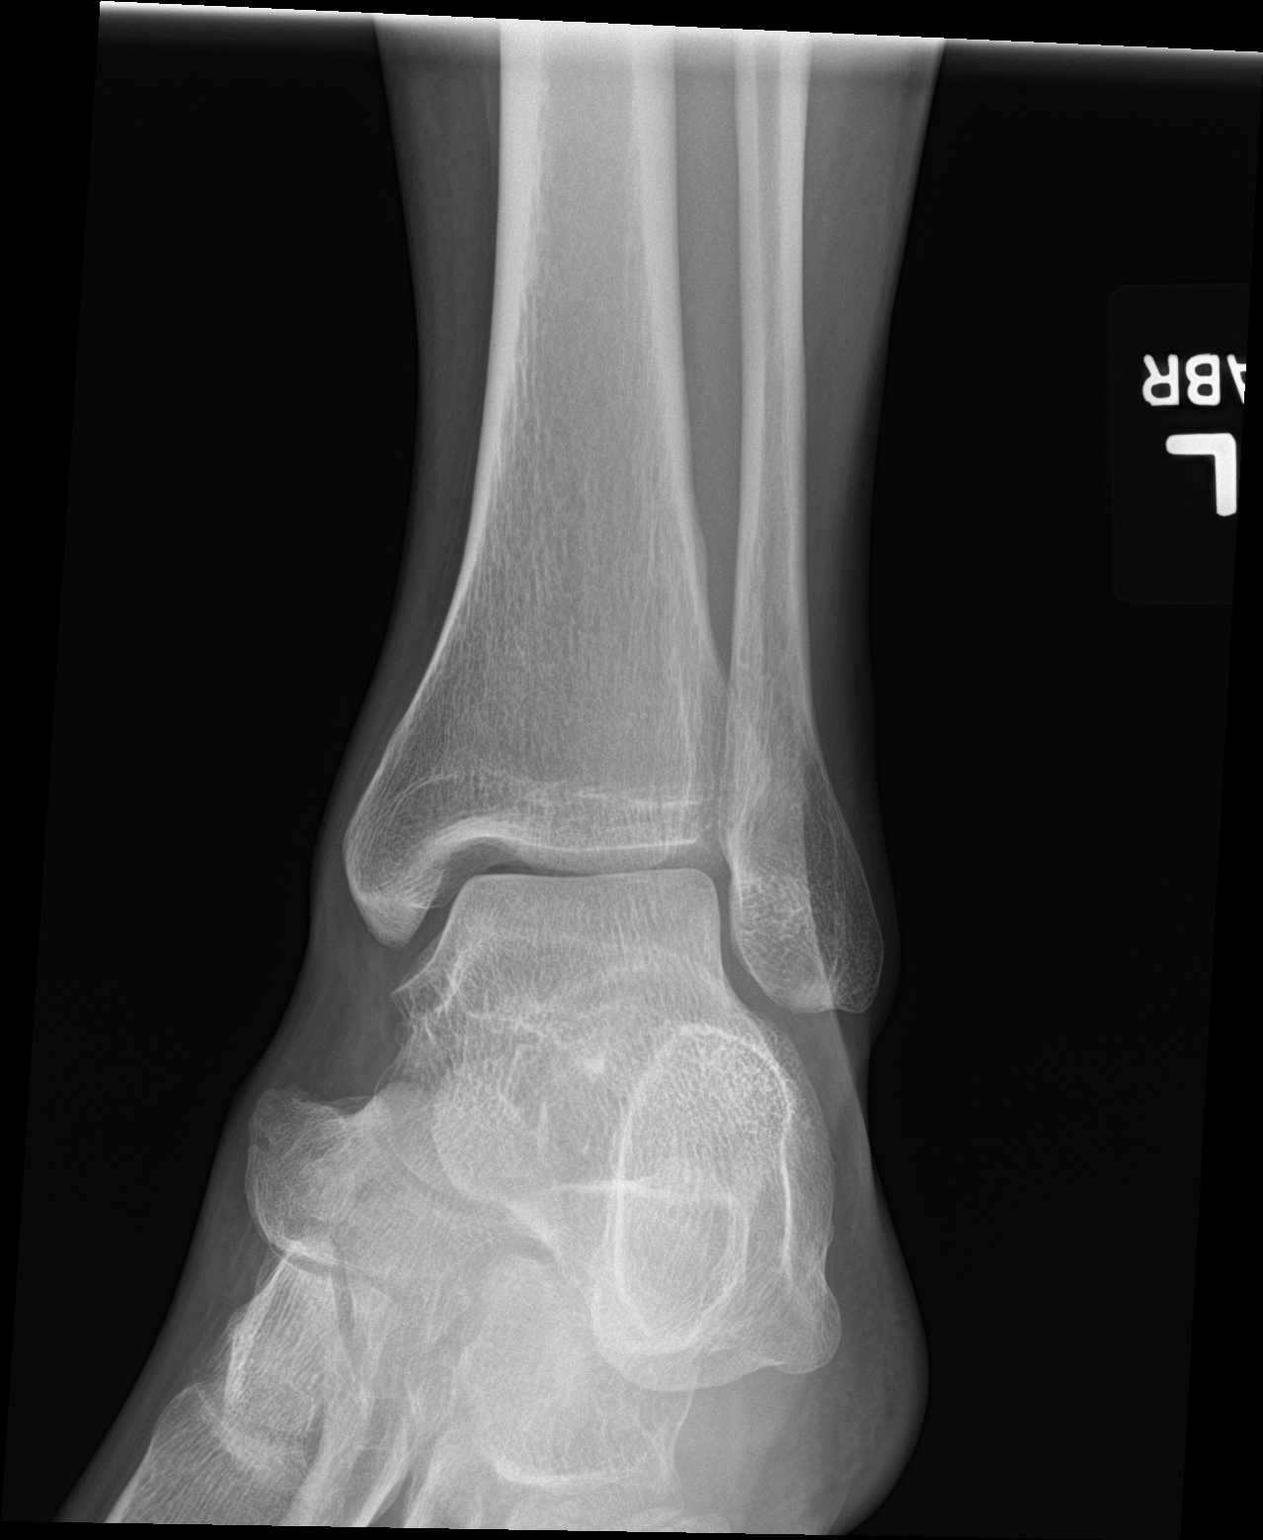

[ankle lat]
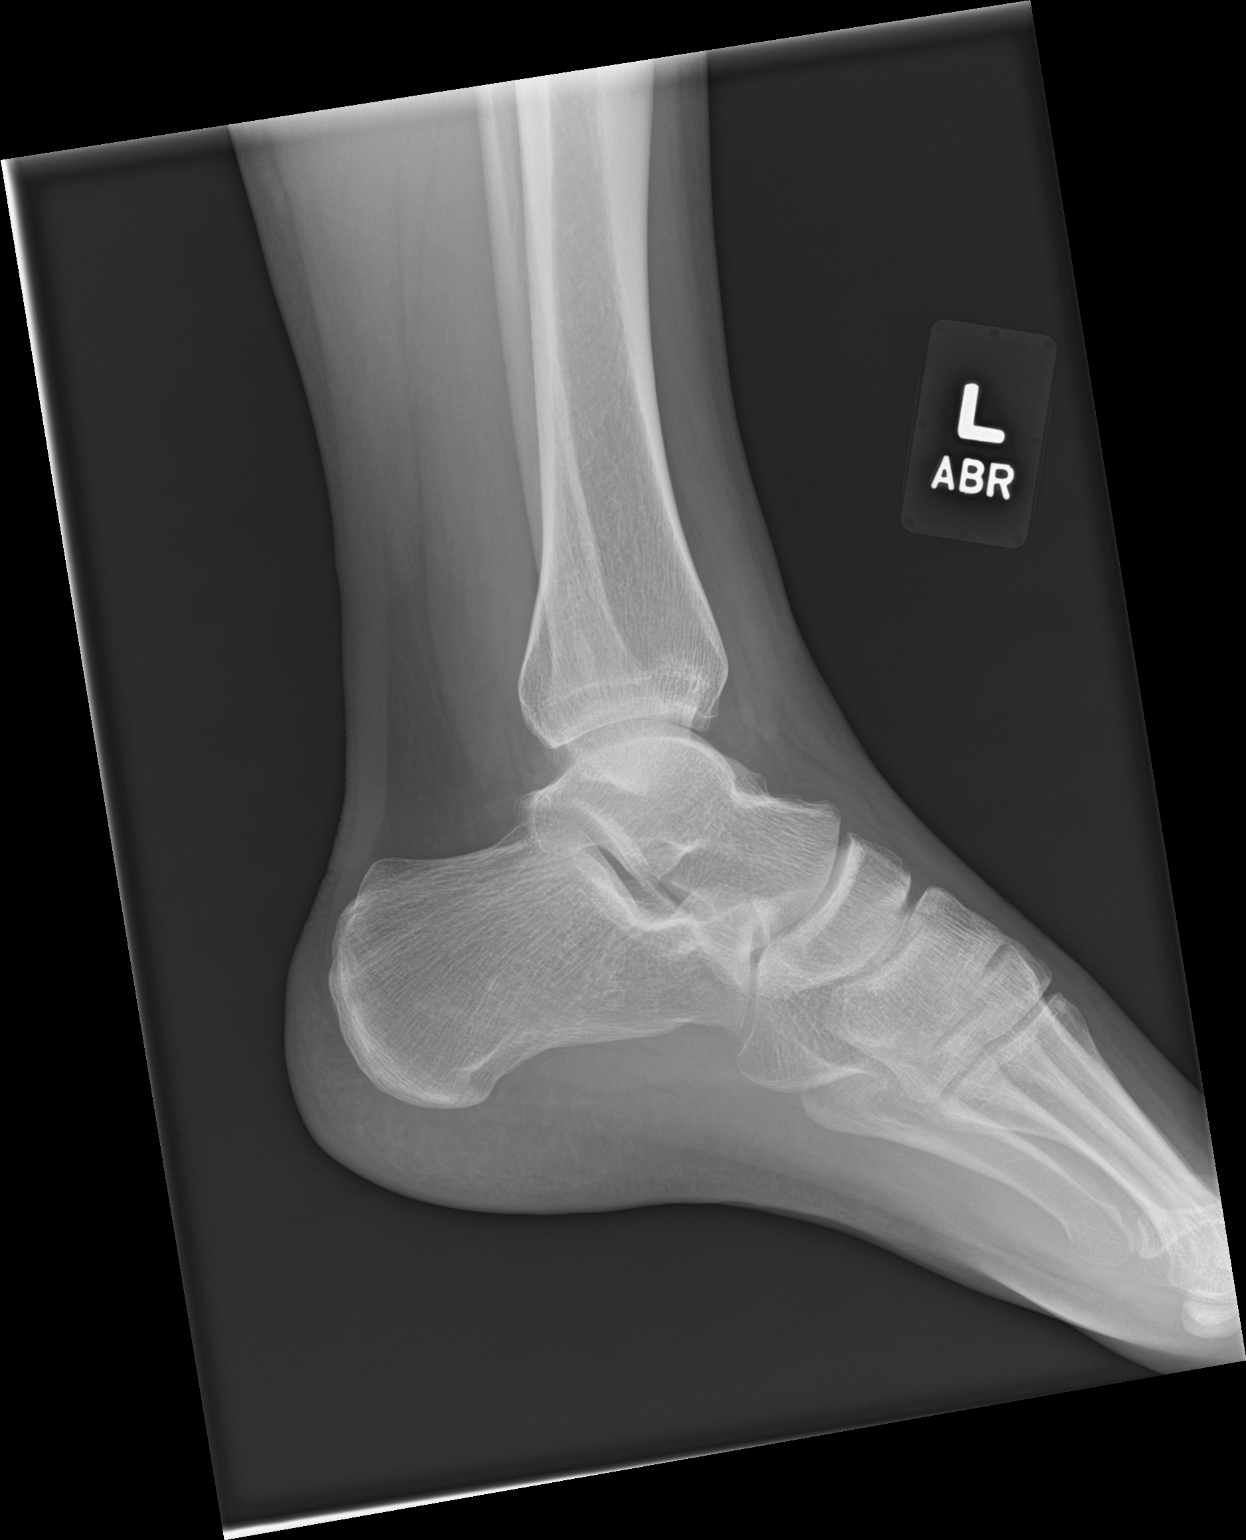

[3 of 3 positions shown; findings below may reference images not displayed]

FINDINGS: Ankle is located without a fracture. However, there is an unusual
configuration of the navicular bone which appears to be more medial
than expected. Fracture in this area is not confidently identified.
No significant soft tissue swelling at the ankle.
IMPRESSION: No acute bone abnormality in left ankle.

Unusual appearance of the midfoot and probably the navicular bone.
Recommend dedicated foot images.

## 2017-10-12 ENCOUNTER — Ambulatory Visit (INDEPENDENT_AMBULATORY_CARE_PROVIDER_SITE_OTHER): Payer: BC Managed Care – PPO

## 2017-10-12 DIAGNOSIS — J309 Allergic rhinitis, unspecified: Secondary | ICD-10-CM | POA: Diagnosis not present

## 2017-10-26 ENCOUNTER — Ambulatory Visit (INDEPENDENT_AMBULATORY_CARE_PROVIDER_SITE_OTHER): Payer: BC Managed Care – PPO

## 2017-10-26 DIAGNOSIS — J309 Allergic rhinitis, unspecified: Secondary | ICD-10-CM

## 2017-11-16 ENCOUNTER — Ambulatory Visit (INDEPENDENT_AMBULATORY_CARE_PROVIDER_SITE_OTHER): Payer: BC Managed Care – PPO | Admitting: *Deleted

## 2017-11-16 DIAGNOSIS — J309 Allergic rhinitis, unspecified: Secondary | ICD-10-CM | POA: Diagnosis not present

## 2017-11-23 ENCOUNTER — Ambulatory Visit (INDEPENDENT_AMBULATORY_CARE_PROVIDER_SITE_OTHER): Payer: BC Managed Care – PPO | Admitting: *Deleted

## 2017-11-23 DIAGNOSIS — J309 Allergic rhinitis, unspecified: Secondary | ICD-10-CM

## 2017-12-07 ENCOUNTER — Ambulatory Visit (INDEPENDENT_AMBULATORY_CARE_PROVIDER_SITE_OTHER): Payer: BC Managed Care – PPO | Admitting: *Deleted

## 2017-12-07 DIAGNOSIS — J309 Allergic rhinitis, unspecified: Secondary | ICD-10-CM | POA: Diagnosis not present

## 2017-12-14 ENCOUNTER — Ambulatory Visit (INDEPENDENT_AMBULATORY_CARE_PROVIDER_SITE_OTHER): Payer: BC Managed Care – PPO

## 2017-12-14 DIAGNOSIS — J309 Allergic rhinitis, unspecified: Secondary | ICD-10-CM

## 2017-12-21 ENCOUNTER — Ambulatory Visit (INDEPENDENT_AMBULATORY_CARE_PROVIDER_SITE_OTHER): Payer: BC Managed Care – PPO

## 2017-12-21 DIAGNOSIS — J309 Allergic rhinitis, unspecified: Secondary | ICD-10-CM | POA: Diagnosis not present

## 2018-01-18 ENCOUNTER — Ambulatory Visit: Payer: Self-pay

## 2018-01-19 ENCOUNTER — Encounter: Payer: Self-pay | Admitting: *Deleted

## 2018-02-01 ENCOUNTER — Ambulatory Visit (INDEPENDENT_AMBULATORY_CARE_PROVIDER_SITE_OTHER): Payer: BC Managed Care – PPO | Admitting: *Deleted

## 2018-02-01 DIAGNOSIS — J309 Allergic rhinitis, unspecified: Secondary | ICD-10-CM

## 2018-02-08 ENCOUNTER — Ambulatory Visit (INDEPENDENT_AMBULATORY_CARE_PROVIDER_SITE_OTHER): Payer: BC Managed Care – PPO

## 2018-02-08 DIAGNOSIS — J309 Allergic rhinitis, unspecified: Secondary | ICD-10-CM | POA: Diagnosis not present

## 2018-02-15 ENCOUNTER — Ambulatory Visit (INDEPENDENT_AMBULATORY_CARE_PROVIDER_SITE_OTHER): Payer: BC Managed Care – PPO | Admitting: *Deleted

## 2018-02-15 DIAGNOSIS — J309 Allergic rhinitis, unspecified: Secondary | ICD-10-CM

## 2018-03-01 ENCOUNTER — Ambulatory Visit (INDEPENDENT_AMBULATORY_CARE_PROVIDER_SITE_OTHER): Payer: BC Managed Care – PPO | Admitting: *Deleted

## 2018-03-01 DIAGNOSIS — J309 Allergic rhinitis, unspecified: Secondary | ICD-10-CM

## 2018-03-08 ENCOUNTER — Ambulatory Visit (INDEPENDENT_AMBULATORY_CARE_PROVIDER_SITE_OTHER): Payer: BC Managed Care – PPO

## 2018-03-08 DIAGNOSIS — J309 Allergic rhinitis, unspecified: Secondary | ICD-10-CM | POA: Diagnosis not present

## 2018-03-10 NOTE — Progress Notes (Signed)
VIALS EXP 03-12-19

## 2018-03-11 DIAGNOSIS — J301 Allergic rhinitis due to pollen: Secondary | ICD-10-CM | POA: Diagnosis not present

## 2018-03-15 ENCOUNTER — Ambulatory Visit (INDEPENDENT_AMBULATORY_CARE_PROVIDER_SITE_OTHER): Payer: BC Managed Care – PPO | Admitting: *Deleted

## 2018-03-15 DIAGNOSIS — J309 Allergic rhinitis, unspecified: Secondary | ICD-10-CM | POA: Diagnosis not present

## 2018-03-16 NOTE — Progress Notes (Signed)
DUPLICATE LABELS

## 2018-03-29 ENCOUNTER — Ambulatory Visit (INDEPENDENT_AMBULATORY_CARE_PROVIDER_SITE_OTHER): Payer: BC Managed Care – PPO

## 2018-03-29 DIAGNOSIS — J309 Allergic rhinitis, unspecified: Secondary | ICD-10-CM | POA: Diagnosis not present

## 2018-04-05 ENCOUNTER — Ambulatory Visit (INDEPENDENT_AMBULATORY_CARE_PROVIDER_SITE_OTHER): Payer: BC Managed Care – PPO | Admitting: *Deleted

## 2018-04-05 DIAGNOSIS — J309 Allergic rhinitis, unspecified: Secondary | ICD-10-CM | POA: Diagnosis not present

## 2018-04-12 ENCOUNTER — Ambulatory Visit (INDEPENDENT_AMBULATORY_CARE_PROVIDER_SITE_OTHER): Payer: BC Managed Care – PPO | Admitting: *Deleted

## 2018-04-12 DIAGNOSIS — J309 Allergic rhinitis, unspecified: Secondary | ICD-10-CM

## 2018-04-26 ENCOUNTER — Ambulatory Visit (INDEPENDENT_AMBULATORY_CARE_PROVIDER_SITE_OTHER): Payer: BC Managed Care – PPO | Admitting: *Deleted

## 2018-04-26 DIAGNOSIS — J309 Allergic rhinitis, unspecified: Secondary | ICD-10-CM | POA: Diagnosis not present

## 2018-05-03 ENCOUNTER — Ambulatory Visit (INDEPENDENT_AMBULATORY_CARE_PROVIDER_SITE_OTHER): Payer: BC Managed Care – PPO

## 2018-05-03 DIAGNOSIS — J309 Allergic rhinitis, unspecified: Secondary | ICD-10-CM | POA: Diagnosis not present

## 2018-05-17 ENCOUNTER — Ambulatory Visit (INDEPENDENT_AMBULATORY_CARE_PROVIDER_SITE_OTHER): Payer: BC Managed Care – PPO

## 2018-05-17 DIAGNOSIS — J309 Allergic rhinitis, unspecified: Secondary | ICD-10-CM | POA: Diagnosis not present

## 2018-05-23 ENCOUNTER — Telehealth: Payer: Self-pay | Admitting: Orthopaedic Surgery

## 2018-05-23 NOTE — Telephone Encounter (Signed)
Patient and wife called to inquire about appointment for early next week; states on vacation at the beach, and he has fallen and fractured right wrist. Calling from urgent care facility; states will request film, report and notes. Relayed also as I was obtaining information that he had surgery on same wrist two years ago by orthopaedist in [place]. Due to recent surgical history, will go back to same surgeon.

## 2018-06-07 ENCOUNTER — Ambulatory Visit (INDEPENDENT_AMBULATORY_CARE_PROVIDER_SITE_OTHER): Payer: BC Managed Care – PPO

## 2018-06-07 DIAGNOSIS — J309 Allergic rhinitis, unspecified: Secondary | ICD-10-CM | POA: Diagnosis not present

## 2018-06-21 ENCOUNTER — Ambulatory Visit (INDEPENDENT_AMBULATORY_CARE_PROVIDER_SITE_OTHER): Payer: BC Managed Care – PPO | Admitting: *Deleted

## 2018-06-21 DIAGNOSIS — J309 Allergic rhinitis, unspecified: Secondary | ICD-10-CM | POA: Diagnosis not present

## 2018-07-12 ENCOUNTER — Ambulatory Visit (INDEPENDENT_AMBULATORY_CARE_PROVIDER_SITE_OTHER): Payer: BC Managed Care – PPO

## 2018-07-12 DIAGNOSIS — J309 Allergic rhinitis, unspecified: Secondary | ICD-10-CM | POA: Diagnosis not present

## 2018-07-19 ENCOUNTER — Ambulatory Visit (INDEPENDENT_AMBULATORY_CARE_PROVIDER_SITE_OTHER): Payer: BC Managed Care – PPO | Admitting: *Deleted

## 2018-07-19 DIAGNOSIS — J309 Allergic rhinitis, unspecified: Secondary | ICD-10-CM

## 2018-07-26 ENCOUNTER — Ambulatory Visit (INDEPENDENT_AMBULATORY_CARE_PROVIDER_SITE_OTHER): Payer: BC Managed Care – PPO | Admitting: *Deleted

## 2018-07-26 DIAGNOSIS — J309 Allergic rhinitis, unspecified: Secondary | ICD-10-CM

## 2018-08-09 ENCOUNTER — Ambulatory Visit (INDEPENDENT_AMBULATORY_CARE_PROVIDER_SITE_OTHER): Payer: BC Managed Care – PPO | Admitting: *Deleted

## 2018-08-09 DIAGNOSIS — J309 Allergic rhinitis, unspecified: Secondary | ICD-10-CM | POA: Diagnosis not present

## 2018-08-16 ENCOUNTER — Ambulatory Visit (INDEPENDENT_AMBULATORY_CARE_PROVIDER_SITE_OTHER): Payer: BC Managed Care – PPO

## 2018-08-16 DIAGNOSIS — J309 Allergic rhinitis, unspecified: Secondary | ICD-10-CM | POA: Diagnosis not present

## 2018-09-09 ENCOUNTER — Encounter: Payer: Self-pay | Admitting: Allergy & Immunology

## 2018-09-09 ENCOUNTER — Ambulatory Visit: Payer: BC Managed Care – PPO | Admitting: Allergy & Immunology

## 2018-09-09 VITALS — BP 122/86 | HR 85 | Temp 98.0°F

## 2018-09-09 DIAGNOSIS — L409 Psoriasis, unspecified: Secondary | ICD-10-CM

## 2018-09-09 DIAGNOSIS — J3089 Other allergic rhinitis: Secondary | ICD-10-CM | POA: Diagnosis not present

## 2018-09-09 DIAGNOSIS — J302 Other seasonal allergic rhinitis: Secondary | ICD-10-CM

## 2018-09-09 DIAGNOSIS — J309 Allergic rhinitis, unspecified: Secondary | ICD-10-CM | POA: Diagnosis not present

## 2018-09-09 MED ORDER — EPINEPHRINE 0.3 MG/0.3ML IJ SOAJ: 0.3000 mg | Freq: Once | INTRAMUSCULAR | 2 refills | Status: AC

## 2018-09-09 MED ORDER — LEVOCETIRIZINE DIHYDROCHLORIDE 5 MG PO TABS: 5.0000 mg | ORAL_TABLET | Freq: Every evening | ORAL | 12 refills | Status: DC

## 2018-09-09 NOTE — Progress Notes (Signed)
FOLLOW UP  Date of Service/Encounter:  09/12/18   Assessment:   Seasonal and perennial allergic rhinitis (grasses, weeds, trees, molds, cockroach, and cat)  Psoriasis - controlled with triamcinolone twice daily PRN   Plan/Recommendations:   1. Chronic allergic rhinitis (grasses, weeds, trees, molds, and cat) - Continue with shots at the same schedule. - Continue with Xyzal 5mg  1-2 times daily as needed. - Continue with Dymista two sprays per nostril 1-2 times daily as needed  2. Psoriasis - Continue with triamcinolone ointment, which can be used twice daily as needed.   3. Return in about 1 year (around 09/10/2019).   Subjective:   Daniel Gibbs is a 32 y.o. male presenting today for follow up of  Chief Complaint  Patient presents with  . Annual Exam    Refill    Daniel Gibbs has a history of the following: Patient Active Problem List   Diagnosis Date Noted  . Seasonal and perennial allergic rhinitis 07/06/2017  . Psoriasis 07/06/2017  . Acute bronchitis 01/11/2017  . Cough 01/11/2017    History obtained from: chart review and the patient.  Daniel Gibbs Primary Care Provider is Lanelle Bal, PA-CSeymore Gibbs is a 32 y.o. male presenting for a follow up visit. He was last seen in August 2018. At that time, he was doing well with regards to his allergy shots. We continued him on Ryvent 6mg  PRN as well as Dymista two sprays per nostril 1-2 times daily.   Since the last visit, Daniel Gibbs has done fairly well. He was on the Ryvent for a period of time, which was working very well. However, the copay card stopped working therefore he has switched back to [place] which has done well. He has had no recent reactions to his allergy shots, although we did have to back down to the [employer] on him. He is no longer taking Dymista on the regular basis.   [facility] is on allergen immunotherapy. He receives two injections. Immunotherapy script #1 contains trees, weeds, grasses and  cat. He currently receives 0.24mL of the GREEN vial (1/1,000). Immunotherapy script #2 contains molds and cockroach. He currently receives 0.61mL of the GREEN vial (1/1,000). He started shots June of 2018 and not yet reached maintenance.   Otherwise, there have been no changes to his past medical history, surgical history, family history, or social history. School is going well. 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 His wife recently got a new job and works from home.    Review of Systems: a 14-point review of systems is pertinent for what is mentioned in HPI.  Otherwise, all other systems were negative.  Constitutional: negative other than that listed in the HPI Eyes: negative other than that listed in the HPI Ears, nose, mouth, throat, and face: negative other than that listed in the HPI Respiratory: negative other than that listed in the HPI Cardiovascular: negative other than that listed in the HPI Gastrointestinal: negative other than that listed in the HPI Genitourinary: negative other than that listed in the HPI Integument: negative other than that listed in the HPI Hematologic: negative other than that listed in the HPI Musculoskeletal: negative other than that listed in the HPI Neurological: negative other than that listed in the HPI Allergy/Immunologic: negative other than that listed in the HPI    Objective:   Blood pressure 122/86, pulse 85, temperature 98 F (  36.7 C), temperature source Oral, SpO2 97 %. There is no height or weight on file to calculate BMI.   Physical Exam:  General: Alert, interactive, in no acute distress. Talkative male.  Eyes: No conjunctival injection present on the right, No conjunctival injection present on the left, no discharge on the right, no discharge on the left and no Horner-Trantas dots present. PERRL bilaterally. EOMI without pain. No photophobia.    Ears: Right TM pearly gray with normal light reflex, Left TM pearly gray with normal light reflex, Right TM intact without perforation and Left TM intact without perforation.  Nose/Throat: External nose within normal limits and septum midline. Turbinates edematous and pale with clear discharge. Posterior oropharynx erythematous without cobblestoning in the posterior oropharynx. Tonsils 2+ without exudates.  Tongue without thrush. Lungs: Clear to auscultation without wheezing, rhonchi or rales. No increased work of breathing. CV: Normal S1/S2. No murmurs. Capillary refill <2 seconds.  Skin: Warm and dry, without lesions or rashes. Neuro:   Grossly intact. No focal deficits appreciated. Responsive to questions.  Diagnostic studies: none     Salvatore Marvel, MD  [facility]

## 2018-09-09 NOTE — Patient Instructions (Addendum)
1. Chronic allergic rhinitis (grasses, weeds, trees, molds, and cat) - Continue with shots at the same schedule. - Continue with Xyzal 5mg  1-2 times daily as needed. - Continue with Dymista two sprays per nostril 1-2 times daily.  2. Psoriasis - Continue with triamcinolone ointment, which can be used twice daily as needed.   3. Return in about 1 year (around 09/10/2019).  Please inform us of any Emergency Department visits, hospitalizations, or changes in symptoms. Call us before going to the ED for breathing or allergy symptoms since we might be able to fit you in for a sick visit. Feel free to contact us anytime with any questions, problems, or concerns.  It was a pleasure to see you again today!   Websites that have reliable patient information: 1. American Academy of Asthma, Allergy, and Immunology: www.aaaai.org 2. Food Allergy Research and Education (FARE): foodallergy.org 3. Mothers of Asthmatics: http://www.asthmacommunitynetwork.org 4. American College of Allergy, Asthma, and Immunology: www.acaai.org

## 2018-09-12 ENCOUNTER — Encounter: Payer: Self-pay | Admitting: Allergy & Immunology

## 2018-09-23 ENCOUNTER — Ambulatory Visit (INDEPENDENT_AMBULATORY_CARE_PROVIDER_SITE_OTHER): Payer: BC Managed Care – PPO | Admitting: *Deleted

## 2018-09-23 DIAGNOSIS — J309 Allergic rhinitis, unspecified: Secondary | ICD-10-CM

## 2018-10-12 ENCOUNTER — Ambulatory Visit (INDEPENDENT_AMBULATORY_CARE_PROVIDER_SITE_OTHER): Payer: BC Managed Care – PPO

## 2018-10-12 DIAGNOSIS — J309 Allergic rhinitis, unspecified: Secondary | ICD-10-CM

## 2018-10-26 ENCOUNTER — Ambulatory Visit (INDEPENDENT_AMBULATORY_CARE_PROVIDER_SITE_OTHER): Payer: BC Managed Care – PPO | Admitting: *Deleted

## 2018-10-26 DIAGNOSIS — J309 Allergic rhinitis, unspecified: Secondary | ICD-10-CM

## 2018-11-25 ENCOUNTER — Ambulatory Visit (INDEPENDENT_AMBULATORY_CARE_PROVIDER_SITE_OTHER): Payer: BC Managed Care – PPO

## 2018-11-25 DIAGNOSIS — J309 Allergic rhinitis, unspecified: Secondary | ICD-10-CM | POA: Diagnosis not present

## 2018-11-30 ENCOUNTER — Ambulatory Visit (INDEPENDENT_AMBULATORY_CARE_PROVIDER_SITE_OTHER): Payer: BC Managed Care – PPO

## 2018-11-30 DIAGNOSIS — J309 Allergic rhinitis, unspecified: Secondary | ICD-10-CM

## 2018-12-07 ENCOUNTER — Ambulatory Visit (INDEPENDENT_AMBULATORY_CARE_PROVIDER_SITE_OTHER): Payer: BC Managed Care – PPO | Admitting: *Deleted

## 2018-12-07 DIAGNOSIS — J309 Allergic rhinitis, unspecified: Secondary | ICD-10-CM | POA: Diagnosis not present

## 2018-12-21 ENCOUNTER — Ambulatory Visit (INDEPENDENT_AMBULATORY_CARE_PROVIDER_SITE_OTHER): Payer: BC Managed Care – PPO

## 2018-12-21 DIAGNOSIS — J309 Allergic rhinitis, unspecified: Secondary | ICD-10-CM

## 2018-12-28 ENCOUNTER — Ambulatory Visit (INDEPENDENT_AMBULATORY_CARE_PROVIDER_SITE_OTHER): Payer: BC Managed Care – PPO

## 2018-12-28 DIAGNOSIS — J309 Allergic rhinitis, unspecified: Secondary | ICD-10-CM | POA: Diagnosis not present

## 2019-01-04 ENCOUNTER — Ambulatory Visit (INDEPENDENT_AMBULATORY_CARE_PROVIDER_SITE_OTHER): Payer: BC Managed Care – PPO | Admitting: *Deleted

## 2019-01-04 DIAGNOSIS — J309 Allergic rhinitis, unspecified: Secondary | ICD-10-CM

## 2019-01-11 ENCOUNTER — Ambulatory Visit (INDEPENDENT_AMBULATORY_CARE_PROVIDER_SITE_OTHER): Payer: BC Managed Care – PPO

## 2019-01-11 DIAGNOSIS — J309 Allergic rhinitis, unspecified: Secondary | ICD-10-CM | POA: Diagnosis not present

## 2020-01-07 ENCOUNTER — Ambulatory Visit: Payer: Self-pay

## 2020-04-11 ENCOUNTER — Encounter (INDEPENDENT_AMBULATORY_CARE_PROVIDER_SITE_OTHER): Payer: Self-pay | Admitting: Gastroenterology

## 2020-04-12 ENCOUNTER — Ambulatory Visit: Payer: BC Managed Care – PPO | Admitting: Allergy & Immunology

## 2020-05-01 ENCOUNTER — Ambulatory Visit: Payer: BC Managed Care – PPO | Admitting: Allergy & Immunology

## 2020-05-01 ENCOUNTER — Encounter: Payer: Self-pay | Admitting: Allergy & Immunology

## 2020-05-01 ENCOUNTER — Other Ambulatory Visit: Payer: Self-pay

## 2020-05-01 VITALS — BP 124/70 | HR 78 | Resp 16 | Ht 73.78 in | Wt 216.0 lb

## 2020-05-01 DIAGNOSIS — J3089 Other allergic rhinitis: Secondary | ICD-10-CM | POA: Diagnosis not present

## 2020-05-01 DIAGNOSIS — L409 Psoriasis, unspecified: Secondary | ICD-10-CM

## 2020-05-01 DIAGNOSIS — J302 Other seasonal allergic rhinitis: Secondary | ICD-10-CM | POA: Diagnosis not present

## 2020-05-01 MED ORDER — CARBINOXAMINE MALEATE 6 MG PO TABS: 1.0000 | ORAL_TABLET | Freq: Two times a day (BID) | ORAL | 5 refills | Status: DC

## 2020-05-01 NOTE — Progress Notes (Signed)
FOLLOW UP  Date of Service/Encounter:  05/01/20   Assessment:   Seasonal and perennial allergic rhinitis (grasses, weeds, trees, molds, cockroach, and cat)  Psoriasis - controlled with triamcinolone twice daily PRN  Vaccinated to [place]  Plan/Recommendations:   1. Chronic allergic rhinitis (grasses, weeds, trees, molds, and cat) - We will restart allergy shots on Schedule B, diluted down to the Silver Vial.  - Continue with Xyzal 5mg  1-2 times daily as needed. - Continue with Dymista two sprays per nostril 1-2 times daily.  2. Psoriasis - Continue with triamcinolone ointment, which can be used twice daily as needed.   3. Return in about 1 year (around 05/01/2021). This can be an in-person, a virtual Webex or a telephone follow up visit.  Subjective:   JADORE VEALS is a 34 y.o. male presenting today for follow up of  Chief Complaint  Patient presents with  . Allergic Rhinitis     swollen eyes, sneezing, snuffiness, blowing nose a lot, clear mucus      Mary Sella Falco has a history of the following: Patient Active Problem List   Diagnosis Date Noted  . Seasonal and perennial allergic rhinitis 07/06/2017  . Psoriasis 07/06/2017  . Acute bronchitis 01/11/2017  . Cough 01/11/2017    History obtained from: chart review and patient.  Zahmir is a 34 y.o. male presenting for a follow up visit.  He was last seen in November 2019.  At that time, we recommended continuing with shots at the same schedule.  We also continue with Xyzal 1-2 times daily as well as Dymista 2 sprays per nostril 1-2 times daily.  In the interim, he decided to stop allergen immunotherapy due to the coronavirus pandemic.  Now that things are opening up again, he is back in school in person.  His symptoms are worsening. 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 Since he started doing normal things  again, his symptoms have worsened.   He really liked the Ryvent, but this was difficult for him to get filled due to problems with the coupon.  He did switch to levocetirizine.  He reports that he is having a lot of mucus down his throat.  He does not have a nose spray.  He does report some sinus pressure.  He does not want any prednisone or antibiotics.  He is interested in starting allergy shots again.  Psoriasis is under good control with TAC as needed. He has never been on a biologic for his symptoms. He did receive his COVID19 vaccine.   Otherwise, there have been no changes to his past medical history, surgical history, family history, or social history.    Review of Systems  Constitutional: Negative.  Negative for chills, fever, malaise/fatigue and weight loss.  HENT: Positive for congestion and sinus pain. Negative for ear discharge and ear pain.        Positive for postnasal drip.  Positive for throat clearing.  Eyes: Negative for pain, discharge and redness.  Respiratory: Negative for cough, sputum production, shortness of breath and wheezing.   Cardiovascular: Negative.  Negative for chest pain and palpitations.  Gastrointestinal: Negative for abdominal pain, constipation, diarrhea, heartburn, nausea and vomiting.  Skin: Negative.  Negative for itching and rash.  Neurological: Negative for dizziness and headaches.  Endo/Heme/Allergies: Positive for environmental allergies. Does not bruise/bleed easily.       Objective:  Blood pressure 124/70, pulse 78, resp. rate 16, height 6' 1.78" (1.874 m), weight 216 lb (98 kg), SpO2 98 %. Body mass index is 27.9 kg/m.   Physical Exam: General:  alert, active, in no acute distress.  Talkative. Head:  normocephalic, no masses, lesions, tenderness or abnormalities Eyes:  conjunctiva clear without injection or discharge, EOMI, PERL Ears:  TM's pearly white bilaterally, external auditory canals are clear, external ears are normally set  and rotated Nose:  External nose within normal limits, markedly enlarged appearing turbinates, clear-colored discharge, septum midline. No polyps appreciated.  Throat:  moist mucous membranes without erythema, exudates or petechiae, no thrush Neck:  Supple without thyromegaly or adenopathy appreciated Lungs:  clear to auscultation, no wheezing, crackles or rhonchi, breathing unlabored, moving air well in all lung fields Heart:  regular rate and rhythm, normal S1/S2, no murmurs or gallops, normal peripheral perfusion Neuro:  Normal mental status, speech normal, alert and oriented x3 Musculoskeletal:  no cyanosis, clubbing or edema Skin:  skin color, texture and turgor are normal; no bruising, rashes or lesions noted. Psych: Normal Affect and mood   Diagnostic studies: none       Salvatore Marvel, MD  [facility]

## 2020-05-01 NOTE — Patient Instructions (Addendum)
1. Chronic allergic rhinitis (grasses, weeds, trees, molds, and cat) - Make an appointment to restart shots in 2-3 weeks.  - We will start the beginning and go on Schedule C since you have been on the shots in the past).  - Stop the levocetirizine and start Ryvent 6mg  2-3 times daily as needed.  - Be more aggressive with the fluticasone/azelastine two sprays per nostril 1-2 times daily (at least until we have the Ryvent back on board).   2. Psoriasis - Continue with triamcinolone ointment, which can be used twice daily as needed.   3. Return in about 1 year (around 05/01/2021). This can be an in-person, a virtual Webex or a telephone follow up visit.   Please inform us of any Emergency Department visits, hospitalizations, or changes in symptoms. Call us before going to the ED for breathing or allergy symptoms since we might be able to fit you in for a sick visit. Feel free to contact us anytime with any questions, problems, or concerns.  It was a pleasure to see you again today!  Websites that have reliable patient information: 1. American Academy of Asthma, Allergy, and Immunology: www.aaaai.org 2. Food Allergy Research and Education (FARE): foodallergy.org 3. Mothers of Asthmatics: http://www.asthmacommunitynetwork.org 4. American College of Allergy, Asthma, and Immunology: www.acaai.org   COVID-19 Vaccine Information can be found at: ShippingScam.co.uk For questions related to vaccine distribution or appointments, please email vaccine@[facility] .com or call 815-499-1979.     "Like" [place] on Facebook and Instagram for our latest updates!        Make sure you are registered to vote! If you have moved or changed any of your contact information, you will need to get this updated before voting!  In some cases, you MAY be able to register to vote online: [email or web address]

## 2020-05-02 ENCOUNTER — Encounter: Payer: Self-pay | Admitting: Allergy & Immunology

## 2020-05-02 DIAGNOSIS — J301 Allergic rhinitis due to pollen: Secondary | ICD-10-CM

## 2020-05-02 NOTE — Progress Notes (Signed)
VIALS EXP 05-02-21

## 2020-05-06 DIAGNOSIS — J3089 Other allergic rhinitis: Secondary | ICD-10-CM

## 2020-05-08 ENCOUNTER — Other Ambulatory Visit: Payer: Self-pay

## 2020-05-08 ENCOUNTER — Ambulatory Visit (INDEPENDENT_AMBULATORY_CARE_PROVIDER_SITE_OTHER): Payer: BC Managed Care – PPO

## 2020-05-08 DIAGNOSIS — J309 Allergic rhinitis, unspecified: Secondary | ICD-10-CM

## 2020-05-08 MED ORDER — EPINEPHRINE 0.3 MG/0.3ML IJ SOAJ
0.3000 mg | Freq: Once | INTRAMUSCULAR | 2 refills | Status: AC
Start: 2020-05-08 — End: 2020-05-08

## 2020-05-09 NOTE — Progress Notes (Signed)
Immunotherapy   Patient Details  Name: NASIIR MONTS MRN: [id number] Date of Birth: Sep 21, 1986  05/09/2020  Mary Sella Mathies restarted injections, waited 30 minutes in the office with a pinpoint reaction in his right arm (pollens/cats) Following schedule: B  Frequency:Weekly Epi-Pen:Yes reordered   Consent signed and patient instructions given.   Isabel Caprice 05/09/2020, 8:47 AM

## 2020-05-15 ENCOUNTER — Ambulatory Visit (INDEPENDENT_AMBULATORY_CARE_PROVIDER_SITE_OTHER): Payer: BC Managed Care – PPO

## 2020-05-15 DIAGNOSIS — J309 Allergic rhinitis, unspecified: Secondary | ICD-10-CM | POA: Diagnosis not present

## 2020-05-20 ENCOUNTER — Other Ambulatory Visit: Payer: Self-pay

## 2020-05-20 ENCOUNTER — Encounter (INDEPENDENT_AMBULATORY_CARE_PROVIDER_SITE_OTHER): Payer: Self-pay | Admitting: Gastroenterology

## 2020-05-20 ENCOUNTER — Ambulatory Visit (INDEPENDENT_AMBULATORY_CARE_PROVIDER_SITE_OTHER): Payer: BC Managed Care – PPO | Admitting: Gastroenterology

## 2020-05-20 DIAGNOSIS — K581 Irritable bowel syndrome with constipation: Secondary | ICD-10-CM | POA: Diagnosis not present

## 2020-05-20 DIAGNOSIS — K649 Unspecified hemorrhoids: Secondary | ICD-10-CM | POA: Diagnosis not present

## 2020-05-20 DIAGNOSIS — K589 Irritable bowel syndrome without diarrhea: Secondary | ICD-10-CM | POA: Insufficient documentation

## 2020-05-20 MED ORDER — MAGNESIUM CITRATE PO SOLN: 1.0000 | Freq: Once | ORAL | 0 refills | Status: AC

## 2020-05-20 MED ORDER — LINACLOTIDE 72 MCG PO CAPS: 72.0000 ug | ORAL_CAPSULE | Freq: Every day | ORAL | 5 refills | Status: DC

## 2020-05-20 MED ORDER — MINERAL OIL RE ENEM: 1.0000 | ENEMA | Freq: Once | RECTAL | 0 refills | Status: AC

## 2020-05-20 NOTE — Progress Notes (Signed)
[name], M.D. Gastroenterology & Hepatology [facility] [address] [place], [place] [address]  [place] [address]  Referring MD: Lanelle Bal PA  I will communicate my assessment and recommendations to the referring MD via EMR. Note: Occasional unusual wording and randomly placed punctuation marks may result from the use of speech recognition technology to transcribe this document"  Chief Complaint: Constipation  History of Present Illness: Daniel Gibbs is a 34 y.o. male with past medical history of anxiety who presents for evaluation of constipation and change in bowel movements.  Patient reports that since March 2020 after COVID-19 arrived to [place], he noticed he progressively had a change in his bowel movement frequency.  He usually had a bowel movement every day and started going every 2 to 3 days.  He reports that when he has a bowel movement it is described as hard pellets that are painful to pass.  Before he has a bowel movement he has significant abdominal distention and discomfort in his right lower quadrant.  After he has his first bowel movement, he has 2 to 3 days of loose bowel movements which can be explosive in nature, usually 2-3 bowel movements per day.  Patient reports that the cycle has been going on for the last year and a half.  She became concerned as the symptoms have been waking him up in the middle the night.  He states that he has tried docusate but feels that it has not helped him in the past.  He has also tried Dulcolax every 2 to 3 months as a way to "clean his bowel".  He has tried fiber and MiraLAX but does not know exactly how much MiraLAX he has taken in the past.  He believes that the change in his bowel movements is related to the fact he has changed his dietary habits and has become less active than previously.  Also, the patient reports intermittent episodes of rectal bleeding,  which has been present for years, he believes it is related to hemorrhoids he has had for years.  States that the rectal bleeding is usually present when he wipes but not on top of his stool.  The patient denies having any nausea, vomiting, fever, chills, melena, hematemesis, jaundice, pruritus or weight changes.  Last EGD: Never Last Colonoscopy: Never  FHx: neg for any gastrointestinal/liver disease, no malignancies Social: neg smoking, alcohol or illicit drug use.  He chews tobacco. Surgical: non contributory  Past Medical History: Past Medical History:  Diagnosis Date   Anxiety    Constipation    Diarrhea    Hemorrhoid    Nausea     Past Surgical History: Past Surgical History:  Procedure Laterality Date   APPENDECTOMY     Surgery for urinary reflux     on left and right    WRIST ARTHROSCOPY WITH FOVEAL TRIANGULAR FIBROCARTILAGE COMPLEX REPAIR Right 05/21/2015   Procedure: WRIST ARTHROSCOPY WITH TRIANGULAR FIBROCARTILAGE COMPLEX REPAIR;  Surgeon: Milly Jakob, MD;  Location: [facility];  Service: Orthopedics;  Laterality: Right;  RIGHT WRIST SCOPE WITH TFCC REPAIR.  GENERAL ANESTHESIA WITH PRE-OP BLOCK    Family History: Family History  Problem Relation Age of Onset   Healthy Mother    Asthma Brother    Healthy Sister     Social History: Social History   Tobacco Use  Smoking Status Former Smoker   Types: Cigarettes  Smokeless Tobacco Current User  Types: Chew   Social History   Substance and Sexual Activity  Alcohol Use Yes   Comment: occas   Social History   Substance and Sexual Activity  Drug Use No    Allergies: Allergies  Allergen Reactions   Bee Venom Anaphylaxis   Other Hives   No Known Allergies    Sulfate Hives    Medications: Current Outpatient Medications  Medication Sig Dispense Refill   acetaminophen (TYLENOL) 325 MG tablet Take 325 mg by mouth as needed for headache.      Carbinoxamine  Maleate (RYVENT) 6 MG TABS Take 1 tablet by mouth 2 (two) times daily. 30 tablet 5   clonazePAM (KLONOPIN) 1 MG tablet Take 1 mg by mouth at bedtime.      EPINEPHrine 0.3 mg/0.3 mL IJ SOAJ injection Inject into the muscle.     HYDROCORTISONE ACE, RECTAL, 30 MG SUPP Place 1 suppository rectally 2 (two) times daily.     sertraline (ZOLOFT) 25 MG tablet Take 25 mg by mouth daily.     valACYclovir (VALTREX) 1000 MG tablet Take 1 g by mouth 2 (two) times daily.     hyoscyamine (LEVSIN) 0.125 MG tablet Levsin 0.125 mg tablet (Patient not taking: Patient reports that he took a few times and this really did not help his cramping.)     levocetirizine (XYZAL) 5 MG tablet Take 1 tablet (5 mg total) by mouth every evening. 30 tablet 12   No current facility-administered medications for this visit.    Review of Systems: GENERAL: negative for malaise, significant weight loss, night sweats and fever HEENT: No changes in hearing or vision, no nose bleeds or other nasal problems. No trouble swallowing NECK: Negative for lumps, goiter, pain and significant neck swelling RESPIRATORY: Negative for cough, wheezing and shortness of breath CARDIOVASCULAR: Negative for chest pain, leg swelling, palpitations, orthopnea GI: SEE HPI MUSCULOSKELETAL: Negative for joint pain or swelling, back pain, and muscle pain. SKIN: Negative for lesions, rash, and itching. PSYCH: Negative for sleep disturbance, mood disorder and recent psychosocial stressors. HEMATOLOGY Negative for prolonged bleeding, bruising easily, and swollen nodes. ENDOCRINE: Negative for cold or heat intolerance, polyuria, polydipsia and goiter. NEURO: negative for lightheadedness, dizziness, tremor, gait imbalance, syncope and seizures. The remainder of the review of systems is noncontributory.   Physical Exam: BP 105/70 (BP Location: Right Arm, Patient Position: Sitting, Cuff Size: Normal)    Pulse 87    Temp 99.1 F (37.3 C) (Oral)    Ht 6\' 1"   (1.854 m)    Wt 212 lb 6.4 oz (96.3 kg)    BMI 28.02 kg/m  GENERAL: The patient is AO x3, in no acute distress. HEENT: Head is normocephalic and atraumatic. EOMI are intact. Mouth is well hydrated and without lesions. NECK: Supple. No masses LUNGS: Clear to auscultation. No presence of rhonchi/wheezing/rales. Adequate chest expansion HEART: RRR, normal s1 and s2. ABDOMEN: Soft, nontender, no guarding, no peritoneal signs, and nondistended. BS +. No masses. EXTREMITIES: Without any cyanosis, clubbing, rash, lesions or edema. NEUROLOGIC: AOx3, no focal motor deficit. SKIN: no jaundice, no rashes   Imaging/Labs: as above  I personally reviewed and interpreted the available labs, imaging and endoscopic files.  Impression and Plan: Daniel Gibbs is a 34 y.o. male with past medical history of anxiety who presents for evaluation of constipation and change in bowel movements.  The patient has presented recurring constipation for the last year and a half which is likely related to changing his lifestyle habits,  he has not presented any red flag signs/symptoms.  His symptoms are consistent with IBS-C, diarrhea episodes are likely due to overflow diarrhea.  At this point, we will check his TSH to rule out hypothyroidism, the patient was encouraged to increase his water intake and start taking more fiber including kiwi and prunes.  I will prescribe a dose of magnesium citrate and mineral oil enema x1 to decrease his stool burden, but he will need to start bowel regimen to avoid this happening again.  The patient was advised to give a try to [facility] for a week 3 cups/day and if there is no improvement he will need to switch to Linzess 72 mcg/day.  Finally, his hemorrhoidal bleeding may improve with softening his bowel movements which she understood.  - Start magnesium citrate x1 - Start mineral oil enema x1 - Start taking Miralax 1 cap every every 8 hours - If no improvement with Miralax after 1 week,  start Linzess 72 mcg qday - Check TSH - Return to clinic in 3 months  All questions were answered.      Harvel Quale, MD Gastroenterology and Hepatology [facility]

## 2020-05-20 NOTE — Patient Instructions (Signed)
Take magnesium citrate x1 Take mineral oil enema x1 Start taking Miralax 1 cap every every 8 hours If no improvement with Miralax after 1 week, start Linzess 72 mcg qday Perform blood workup Return to clinic in 3 months

## 2020-05-22 ENCOUNTER — Ambulatory Visit: Payer: BC Managed Care – PPO

## 2020-05-22 ENCOUNTER — Ambulatory Visit (INDEPENDENT_AMBULATORY_CARE_PROVIDER_SITE_OTHER): Payer: BC Managed Care – PPO

## 2020-05-22 ENCOUNTER — Telehealth (INDEPENDENT_AMBULATORY_CARE_PROVIDER_SITE_OTHER): Payer: Self-pay | Admitting: Gastroenterology

## 2020-05-22 DIAGNOSIS — J309 Allergic rhinitis, unspecified: Secondary | ICD-10-CM | POA: Diagnosis not present

## 2020-05-22 LAB — TSH: TSH: 0.94 mIU/L (ref 0.40–4.50)

## 2020-05-22 NOTE — Telephone Encounter (Signed)
Called patient to discuss results of lab tests which showed normal TSH function.  Patient understood and agreed.   Harvel Quale, MD Gastroenterology and Hepatology [facility]

## 2020-05-31 ENCOUNTER — Ambulatory Visit (INDEPENDENT_AMBULATORY_CARE_PROVIDER_SITE_OTHER): Payer: BC Managed Care – PPO

## 2020-05-31 DIAGNOSIS — J309 Allergic rhinitis, unspecified: Secondary | ICD-10-CM

## 2020-06-07 ENCOUNTER — Ambulatory Visit (INDEPENDENT_AMBULATORY_CARE_PROVIDER_SITE_OTHER): Payer: BC Managed Care – PPO

## 2020-06-07 DIAGNOSIS — J309 Allergic rhinitis, unspecified: Secondary | ICD-10-CM

## 2020-06-12 ENCOUNTER — Ambulatory Visit (INDEPENDENT_AMBULATORY_CARE_PROVIDER_SITE_OTHER): Payer: BC Managed Care – PPO

## 2020-06-12 DIAGNOSIS — J309 Allergic rhinitis, unspecified: Secondary | ICD-10-CM | POA: Diagnosis not present

## 2020-08-21 ENCOUNTER — Encounter (INDEPENDENT_AMBULATORY_CARE_PROVIDER_SITE_OTHER): Payer: Self-pay | Admitting: Gastroenterology

## 2020-08-21 ENCOUNTER — Other Ambulatory Visit: Payer: Self-pay

## 2020-08-21 ENCOUNTER — Ambulatory Visit (INDEPENDENT_AMBULATORY_CARE_PROVIDER_SITE_OTHER): Payer: BC Managed Care – PPO | Admitting: Gastroenterology

## 2020-08-21 VITALS — BP 107/69 | HR 78 | Temp 98.7°F | Ht 73.0 in | Wt 202.5 lb

## 2020-08-21 DIAGNOSIS — K581 Irritable bowel syndrome with constipation: Secondary | ICD-10-CM | POA: Diagnosis not present

## 2020-08-21 DIAGNOSIS — K649 Unspecified hemorrhoids: Secondary | ICD-10-CM | POA: Diagnosis not present

## 2020-08-21 NOTE — Patient Instructions (Signed)
Schedule colonoscopy Referral to [facility] for hemorrhoidectomy Continue with Miralax and fiber intake

## 2020-08-21 NOTE — Progress Notes (Signed)
Daniel Gibbs, M.D. Gastroenterology & Hepatology [facility] [address] [place], [place] [address]  Primary Care Physician: Lanelle Bal, PA-C [place] [address]  I will communicate my assessment and recommendations to the referring MD via EMR. "Note: Occasional unusual wording and randomly placed punctuation marks may result from the use of speech recognition technology to transcribe this document"  Problems: 1. External hemorrhoids 2. Rectal bleeding 3. IBS-C  History of Present Illness: Daniel Gibbs is a  34 y.o. male with past medical history of anxiety  and IBS-C, who presents for follow up of constipation, rectal bleeding and external hemorrhoids.  The patient was last seen on 05/20/2020. At that time, the patient was counseled to take laxatives orally and rectally to improve his worsening constipation, but also was advised to start taking MiraLAX every 8 hours.  TSH was checked which was within normal limits.  Patient states that since he implemented these changes, his bowel movements have become softer than in the past and he has to strain less.  He denies any more abdominal pain as in the past, when he used to have a lot of gas and bloating but sometimes he has urgency to have bowel movement.  He describes his bowel movements as soft palates small in amount.  However, he reports that the dyschezia has persisted even though he is having softer bowel movements.  He also is also presenting persistently intermittent rectal bleeding with his bowel movements.  He believes his hemorrhoids burst intermittently as he has felt a decrease in pressure in the rectal area when he feels that the hemorrhoid burst.  He has tried using Anusol suppositories with partial relief which is transient.  He has also used Epson salts Fonnie Mu which relieves his symptoms when they are very severe.  The patient denies having any nausea, vomiting,  fever, chills, melena, hematemesis, abdominal distention, abdominal pain, diarrhea, jaundice, pruritus or weight loss.  Past Medical History: Past Medical History:  Diagnosis Date  . Anxiety   . Constipation   . Diarrhea   . Hemorrhoid   . Nausea     Past Surgical History: Past Surgical History:  Procedure Laterality Date  . APPENDECTOMY    . Surgery for urinary reflux     on left and right   . WRIST ARTHROSCOPY WITH FOVEAL TRIANGULAR FIBROCARTILAGE COMPLEX REPAIR Right 05/21/2015   Procedure: WRIST ARTHROSCOPY WITH TRIANGULAR FIBROCARTILAGE COMPLEX REPAIR;  Surgeon: Milly Jakob, MD;  Location: [facility];  Service: Orthopedics;  Laterality: Right;  RIGHT WRIST SCOPE WITH TFCC REPAIR.  GENERAL ANESTHESIA WITH PRE-OP BLOCK    Family History: Family History  Problem Relation Age of Onset  . Healthy Mother   . Asthma Brother   . Healthy Sister     Social History: Social History   Tobacco Use  Smoking Status Former Smoker  . Types: Cigarettes  Smokeless Tobacco Current User  . Types: Chew   Social History   Substance and Sexual Activity  Alcohol Use Yes   Comment: occas   Social History   Substance and Sexual Activity  Drug Use No    Allergies: Allergies  Allergen Reactions  . Bee Venom Anaphylaxis  . Other Hives  . No Known Allergies   . Sulfate Hives    Medications: Current Outpatient Medications  Medication Sig Dispense Refill  . acetaminophen (TYLENOL) 325 MG tablet Take 325 mg by mouth as needed for headache.     Marland Kitchen  Carbinoxamine Maleate (RYVENT) 6 MG TABS Take 1 tablet by mouth 2 (two) times daily. 30 tablet 5  . clonazePAM (KLONOPIN) 1 MG tablet Take 1 mg by mouth at bedtime.     Marland Kitchen EPINEPHrine 0.3 mg/0.3 mL IJ SOAJ injection Inject into the muscle.    Marland Kitchen HYDROCORTISONE ACE, RECTAL, 30 MG SUPP Place 1 suppository rectally 2 (two) times daily.    . sertraline (ZOLOFT) 25 MG tablet Take 25 mg by mouth daily.    . valACYclovir  (VALTREX) 1000 MG tablet Take 1 g by mouth 2 (two) times daily.    Marland Kitchen levocetirizine (XYZAL) 5 MG tablet Take 1 tablet (5 mg total) by mouth every evening. 30 tablet 12  . linaclotide (LINZESS) 72 MCG capsule Take 1 capsule (72 mcg total) by mouth daily. 30 capsule 5   No current facility-administered medications for this visit.    Review of Systems: GENERAL: negative for malaise, night sweats HEENT: No changes in hearing or vision, no nose bleeds or other nasal problems. NECK: Negative for lumps, goiter, pain and significant neck swelling RESPIRATORY: Negative for cough, wheezing CARDIOVASCULAR: Negative for chest pain, leg swelling, palpitations, orthopnea GI: SEE HPI MUSCULOSKELETAL: Negative for joint pain or swelling, back pain, and muscle pain. SKIN: Negative for lesions, rash PSYCH: Negative for sleep disturbance, mood disorder and recent psychosocial stressors. HEMATOLOGY Negative for prolonged bleeding, bruising easily, and swollen nodes. ENDOCRINE: Negative for cold or heat intolerance, polyuria, polydipsia and goiter. NEURO: negative for tremor, gait imbalance, syncope and seizures. The remainder of the review of systems is noncontributory.   Physical Exam: BP 107/69 (BP Location: Right Arm, Patient Position: Sitting, Cuff Size: Small)   Pulse 78   Temp 98.7 F (37.1 C) (Oral)   Ht 6\' 1"  (1.854 m)   Wt 202 lb 8 oz (91.9 kg)   BMI 26.72 kg/m  GENERAL: The patient is AO x3, in no acute distress. HEENT: Head is normocephalic and atraumatic. EOMI are intact. Mouth is well hydrated and without lesions. NECK: Supple. No masses LUNGS: Clear to auscultation. No presence of rhonchi/wheezing/rales. Adequate chest expansion HEART: RRR, normal s1 and s2. ABDOMEN: Soft, nontender, no guarding, no peritoneal signs, and nondistended. BS +. No masses. EXTREMITIES: Without any cyanosis, clubbing, rash, lesions or edema. NEUROLOGIC: AOx3, no focal motor deficit. SKIN: no jaundice, no  rashes  Imaging/Labs: as above  I personally reviewed and interpreted the available labs, imaging and endoscopic files.  Impression and Plan: Daniel Gibbs is a  34 y.o. male with past medical history of anxiety  and IBS-C, who presents for follow up of constipation, rectal bleeding and external hemorrhoids.  The patient has presented some improvement in his IBS symptoms as he is not having more episodes of abdominal cramping in his stools are softer in consistency with the use of MiraLAX.  I consider he would benefit from continuing this medication at his current dose.  However, he is still presenting significant rectal pain with his bowel movements, which will need to have evaluation by colorectal surgery as he may benefit from possible banding versus hemorrhoidectomy as they have been very symptomatic and have not improved with medical treatment.  Nevertheless, the patient has presented persistent rectal bleeding which could be related to his hemorrhoids, but given the persistence of symptoms I explained to him it would be important to perform a colonoscopy to rule out any more concerning lesions such as colorectal malignancy. Patient understood and agreed.  - Schedule colonoscopy - Referral to  [facility] for hemorrhoidectomy - Continue with Miralax and fiber intake  All questions were answered.      Harvel Quale, MD Gastroenterology and Hepatology [facility]

## 2020-08-22 ENCOUNTER — Encounter (INDEPENDENT_AMBULATORY_CARE_PROVIDER_SITE_OTHER): Payer: Self-pay | Admitting: *Deleted

## 2020-08-22 ENCOUNTER — Telehealth (INDEPENDENT_AMBULATORY_CARE_PROVIDER_SITE_OTHER): Payer: Self-pay | Admitting: *Deleted

## 2020-08-22 ENCOUNTER — Other Ambulatory Visit (INDEPENDENT_AMBULATORY_CARE_PROVIDER_SITE_OTHER): Payer: Self-pay | Admitting: *Deleted

## 2020-08-22 MED ORDER — PLENVU 140 G PO SOLR: 1.0000 | Freq: Once | ORAL | 0 refills | Status: AC

## 2020-08-22 NOTE — Telephone Encounter (Signed)
Patient needs Plenvu (copay card) ° °

## 2020-09-10 ENCOUNTER — Other Ambulatory Visit (HOSPITAL_COMMUNITY)
Admission: RE | Admit: 2020-09-10 | Discharge: 2020-09-10 | Disposition: A | Discharge status: Home / Self Care | Payer: BC Managed Care – PPO | Source: Ambulatory Visit | Attending: Gastroenterology | Admitting: Gastroenterology

## 2020-09-10 NOTE — Progress Notes (Signed)
Patient was scheduled for his procedure and covid screening on 08-22-2020. When you go into Chart Review beside Surgery (Not Scheduled). Then when you click on Surgery and scroll down to Case Classification: Scheduled. Then look over to your right at Status: Not Scheduled. When I scrolled further down to Pre-Incision Documentation Confirmed at scheduling  None Verification Registration No Case History. Nothing further needed.

## 2020-10-01 ENCOUNTER — Other Ambulatory Visit (HOSPITAL_COMMUNITY)
Admission: RE | Admit: 2020-10-01 | Discharge: 2020-10-01 | Disposition: A | Discharge status: Home / Self Care | Payer: BC Managed Care – PPO | Source: Ambulatory Visit | Attending: Gastroenterology | Admitting: Gastroenterology

## 2020-10-01 ENCOUNTER — Other Ambulatory Visit: Payer: Self-pay

## 2020-10-01 DIAGNOSIS — F419 Anxiety disorder, unspecified: Secondary | ICD-10-CM | POA: Diagnosis not present

## 2020-10-01 DIAGNOSIS — Z8719 Personal history of other diseases of the digestive system: Secondary | ICD-10-CM | POA: Diagnosis not present

## 2020-10-01 DIAGNOSIS — Z20822 Contact with and (suspected) exposure to covid-19: Secondary | ICD-10-CM | POA: Insufficient documentation

## 2020-10-01 DIAGNOSIS — D123 Benign neoplasm of transverse colon: Secondary | ICD-10-CM | POA: Diagnosis not present

## 2020-10-01 DIAGNOSIS — K625 Hemorrhage of anus and rectum: Secondary | ICD-10-CM | POA: Diagnosis present

## 2020-10-01 DIAGNOSIS — K626 Ulcer of anus and rectum: Secondary | ICD-10-CM | POA: Diagnosis not present

## 2020-10-01 DIAGNOSIS — Z79899 Other long term (current) drug therapy: Secondary | ICD-10-CM | POA: Diagnosis not present

## 2020-10-01 DIAGNOSIS — Z01812 Encounter for preprocedural laboratory examination: Secondary | ICD-10-CM | POA: Insufficient documentation

## 2020-10-01 DIAGNOSIS — K5730 Diverticulosis of large intestine without perforation or abscess without bleeding: Secondary | ICD-10-CM | POA: Diagnosis not present

## 2020-10-01 LAB — SARS CORONAVIRUS 2 (TAT 6-24 HRS): SARS Coronavirus 2: NEGATIVE

## 2020-10-02 ENCOUNTER — Encounter (HOSPITAL_COMMUNITY)
Admission: RE | Disposition: A | Discharge status: Home / Self Care | Payer: Self-pay | Source: Home / Self Care | Attending: Gastroenterology

## 2020-10-02 ENCOUNTER — Ambulatory Visit (HOSPITAL_COMMUNITY): Payer: BC Managed Care – PPO | Admitting: Anesthesiology

## 2020-10-02 ENCOUNTER — Encounter (HOSPITAL_COMMUNITY): Payer: Self-pay | Admitting: Gastroenterology

## 2020-10-02 ENCOUNTER — Encounter (INDEPENDENT_AMBULATORY_CARE_PROVIDER_SITE_OTHER): Payer: Self-pay | Admitting: *Deleted

## 2020-10-02 ENCOUNTER — Ambulatory Visit (HOSPITAL_COMMUNITY)
Admission: RE | Admit: 2020-10-02 | Discharge: 2020-10-02 | Disposition: A | Discharge status: Home / Self Care | Payer: BC Managed Care – PPO | Attending: Gastroenterology | Admitting: Gastroenterology

## 2020-10-02 ENCOUNTER — Other Ambulatory Visit: Payer: Self-pay

## 2020-10-02 DIAGNOSIS — Z8719 Personal history of other diseases of the digestive system: Secondary | ICD-10-CM | POA: Insufficient documentation

## 2020-10-02 DIAGNOSIS — K625 Hemorrhage of anus and rectum: Secondary | ICD-10-CM | POA: Insufficient documentation

## 2020-10-02 DIAGNOSIS — K626 Ulcer of anus and rectum: Secondary | ICD-10-CM | POA: Insufficient documentation

## 2020-10-02 DIAGNOSIS — D123 Benign neoplasm of transverse colon: Secondary | ICD-10-CM | POA: Insufficient documentation

## 2020-10-02 DIAGNOSIS — K5730 Diverticulosis of large intestine without perforation or abscess without bleeding: Secondary | ICD-10-CM | POA: Insufficient documentation

## 2020-10-02 DIAGNOSIS — K633 Ulcer of intestine: Secondary | ICD-10-CM | POA: Diagnosis not present

## 2020-10-02 DIAGNOSIS — Z20822 Contact with and (suspected) exposure to covid-19: Secondary | ICD-10-CM | POA: Insufficient documentation

## 2020-10-02 DIAGNOSIS — F419 Anxiety disorder, unspecified: Secondary | ICD-10-CM | POA: Insufficient documentation

## 2020-10-02 DIAGNOSIS — Z79899 Other long term (current) drug therapy: Secondary | ICD-10-CM | POA: Insufficient documentation

## 2020-10-02 HISTORY — PX: POLYPECTOMY: SHX5525

## 2020-10-02 HISTORY — PX: COLONOSCOPY WITH PROPOFOL: SHX5780

## 2020-10-02 LAB — HM COLONOSCOPY

## 2020-10-02 SURGERY — COLONOSCOPY WITH PROPOFOL
Anesthesia: General

## 2020-10-02 MED ORDER — PROPOFOL 10 MG/ML IV BOLUS
INTRAVENOUS | Status: DC | PRN
  Administered 2020-10-02 (×2): 30 mg via INTRAVENOUS
  Administered 2020-10-02: 100 mg via INTRAVENOUS
  Administered 2020-10-02: 40 mg via INTRAVENOUS
  Administered 2020-10-02: 50 mg via INTRAVENOUS

## 2020-10-02 MED ORDER — LIDOCAINE HCL (CARDIAC) PF 100 MG/5ML IV SOSY
PREFILLED_SYRINGE | INTRAVENOUS | Status: DC | PRN
  Administered 2020-10-02: 50 mg via INTRAVENOUS

## 2020-10-02 MED ORDER — STERILE WATER FOR IRRIGATION IR SOLN
Status: DC | PRN
  Administered 2020-10-02: 1.5 mL

## 2020-10-02 MED ORDER — LACTATED RINGERS IV SOLN: INTRAVENOUS | Status: DC | PRN

## 2020-10-02 MED ORDER — LACTATED RINGERS IV SOLN: Freq: Once | INTRAVENOUS | Status: AC

## 2020-10-02 MED ORDER — PROPOFOL 500 MG/50ML IV EMUL
INTRAVENOUS | Status: DC | PRN
  Administered 2020-10-02: 200 ug/kg/min via INTRAVENOUS

## 2020-10-02 NOTE — Anesthesia Preprocedure Evaluation (Addendum)
Anesthesia Evaluation  Patient identified by MRN, date of birth, ID band Patient awake    Reviewed: Allergy & Precautions, NPO status , Patient's Chart, lab work & pertinent test results  History of Anesthesia Complications Negative for: history of anesthetic complications  Airway Mallampati: II  TM Distance: >3 FB Neck ROM: Full    Dental  (+) Dental Advisory Given, Teeth Intact   Pulmonary neg pulmonary ROS,    Pulmonary exam normal breath sounds clear to auscultation       Cardiovascular Exercise Tolerance: Good Normal cardiovascular exam Rhythm:Regular Rate:Normal     Neuro/Psych Anxiety Restless leg syndrome     GI/Hepatic Neg liver ROS, Nausea    Endo/Other  negative endocrine ROS  Renal/GU negative Renal ROS     Musculoskeletal negative musculoskeletal ROS (+)   Abdominal   Peds  Hematology negative hematology ROS (+)   Anesthesia Other Findings   Reproductive/Obstetrics negative OB ROS                            Anesthesia Physical Anesthesia Plan  ASA: II  Anesthesia Plan: General   Post-op Pain Management:    Induction: Intravenous  PONV Risk Score and Plan: TIVA  Airway Management Planned: Nasal Cannula, Natural Airway and Simple Face Mask  Additional Equipment:   Intra-op Plan:   Post-operative Plan:   Informed Consent: I have reviewed the patients History and Physical, chart, labs and discussed the procedure including the risks, benefits and alternatives for the proposed anesthesia with the patient or authorized representative who has indicated his/her understanding and acceptance.     Dental advisory given  Plan Discussed with: CRNA and Surgeon  Anesthesia Plan Comments:         Anesthesia Quick Evaluation

## 2020-10-02 NOTE — Anesthesia Postprocedure Evaluation (Signed)
Anesthesia Post Note  Patient: Daniel Gibbs  Procedure(s) Performed: COLONOSCOPY WITH PROPOFOL (N/A ) POLYPECTOMY  Patient location during evaluation: Phase II Anesthesia Type: General Level of consciousness: awake, oriented and awake and alert Pain management: pain level controlled Vital Signs Assessment: post-procedure vital signs reviewed and stable Respiratory status: spontaneous breathing, respiratory function stable and nonlabored ventilation Cardiovascular status: blood pressure returned to baseline and stable Postop Assessment: no apparent nausea or vomiting Anesthetic complications: no   No complications documented.   Last Vitals:  Vitals:   10/02/20 0655 10/02/20 0807  BP: 115/75 98/66  Pulse:  68  Resp: 13 16  Temp: 36.6 C   SpO2: 97% 97%    Last Pain:  Vitals:   10/02/20 0809  TempSrc:   PainSc: 0-No pain                 Karna Dupes

## 2020-10-02 NOTE — Discharge Instructions (Signed)
Colonoscopy, Adult, Care After This sheet gives you information about how to care for yourself after your procedure. Your doctor may also give you more specific instructions. If you have problems or questions, call your doctor. What can I expect after the procedure? After the procedure, it is common to have:  A small amount of blood in your poop (stool) for 24 hours.  Some gas.  Mild cramping or bloating in your belly (abdomen). Follow these instructions at home: Eating and drinking   Drink enough fluid to keep your pee (urine) pale yellow.  Follow instructions from your doctor about what you cannot eat or drink.  Return to your normal diet as told by your doctor. Avoid heavy or fried foods that are hard to digest. Activity  Rest as told by your doctor.  Do not sit for a long time without moving. Get up to take short walks every 1-2 hours. This is important. Ask for help if you feel weak or unsteady.  Return to your normal activities as told by your doctor. Ask your doctor what activities are safe for you. To help cramping and bloating:   Try walking around.  Put heat on your belly as told by your doctor. Use the heat source that your doctor recommends, such as a moist heat pack or a heating pad. ? Put a towel between your skin and the heat source. ? Leave the heat on for 20-30 minutes. ? Remove the heat if your skin turns bright red. This is very important if you are unable to feel pain, heat, or cold. You may have a greater risk of getting burned. General instructions  For the first 24 hours after the procedure: ? Do not drive or use machinery. ? Do not sign important documents. ? Do not drink alcohol. ? Do your daily activities more slowly than normal. ? Eat foods that are soft and easy to digest.  Take over-the-counter or prescription medicines only as told by your doctor.  Keep all follow-up visits as told by your doctor. This is important. Contact a doctor  if:  You have blood in your poop 2-3 days after the procedure. Get help right away if:  You have more than a small amount of blood in your poop.  You see large clumps of tissue (blood clots) in your poop.  Your belly is swollen.  You feel like you may vomit (nauseous).  You vomit.  You have a fever.  You have belly pain that gets worse, and medicine does not help your pain. Summary  After the procedure, it is common to have a small amount of blood in your poop. You may also have mild cramping and bloating in your belly.  For the first 24 hours after the procedure, do not drive or use machinery, do not sign important documents, and do not drink alcohol.  Get help right away if you have a lot of blood in your poop, feel like you may vomit, have a fever, or have more belly pain. This information is not intended to replace advice given to you by your health care provider. Make sure you discuss any questions you have with your health care provider. Document Revised: 05/22/2019 Document Reviewed: 05/22/2019 Elsevier Patient Education  [place]. Colon Polyps  Polyps are tissue growths inside the body. Polyps can grow in many places, including the large intestine (colon). A polyp may be a round bump or a mushroom-shaped growth. You could have one polyp or several. Most  colon polyps are noncancerous (benign). However, some colon polyps can become cancerous over time. Finding and removing the polyps early can help prevent this. What are the causes? The exact cause of colon polyps is not known. What increases the risk? You are more likely to develop this condition if you:  Have a family history of colon cancer or colon polyps.  Are older than 18 or older than 45 if you are African American.  Have inflammatory bowel disease, such as ulcerative colitis or Crohn's disease.  Have certain hereditary conditions, such as: ? Familial adenomatous polyposis. ? Lynch  syndrome. ? Turcot syndrome. ? Peutz-Jeghers syndrome.  Are overweight.  Smoke cigarettes.  Do not get enough exercise.  Drink too much alcohol.  Eat a diet that is high in fat and red meat and low in fiber.  Had childhood cancer that was treated with abdominal radiation. What are the signs or symptoms? Most polyps do not cause symptoms. If you have symptoms, they may include:  Blood coming from your rectum when having a bowel movement.  Blood in your stool. The stool may look dark red or black.  Abdominal pain.  A change in bowel habits, such as constipation or diarrhea. How is this diagnosed? This condition is diagnosed with a colonoscopy. This is a procedure in which a lighted, flexible scope is inserted into the anus and then passed into the colon to examine the area. Polyps are sometimes found when a colonoscopy is done as part of routine cancer screening tests. How is this treated? Treatment for this condition involves removing any polyps that are found. Most polyps can be removed during a colonoscopy. Those polyps will then be tested for cancer. Additional treatment may be needed depending on the results of testing. Follow these instructions at home: Lifestyle  Maintain a healthy weight, or lose weight if recommended by your health care provider.  Exercise every day or as told by your health care provider.  Do not use any products that contain nicotine or tobacco, such as cigarettes and e-cigarettes. If you need help quitting, ask your health care provider.  If you drink alcohol, limit how much you have: ? 0-1 drink a day for women. ? 0-2 drinks a day for men.  Be aware of how much alcohol is in your drink. In the [place] one drink equals one 12 oz bottle of beer (355 mL), one 5 oz glass of wine (148 mL), or one 1 oz shot of hard liquor (44 mL). Eating and drinking   Eat foods that are high in fiber, such as fruits, vegetables, and whole grains.  Eat foods that  are high in calcium and vitamin D, such as milk, cheese, yogurt, eggs, liver, fish, and broccoli.  Limit foods that are high in fat, such as fried foods and desserts.  Limit the amount of red meat and processed meat you eat, such as hot dogs, sausage, bacon, and lunch meats. General instructions  Keep all follow-up visits as told by your health care provider. This is important. ? This includes having regularly scheduled colonoscopies. ? Talk to your health care provider about when you need a colonoscopy. Contact a health care provider if:  You have new or worsening bleeding during a bowel movement.  You have new or increased blood in your stool.  You have a change in bowel habits.  You lose weight for no known reason. Summary  Polyps are tissue growths inside the body. Polyps can grow in many places,  including the colon.  Most colon polyps are noncancerous (benign), but some can become cancerous over time.  This condition is diagnosed with a colonoscopy.  Treatment for this condition involves removing any polyps that are found. Most polyps can be removed during a colonoscopy. This information is not intended to replace advice given to you by your health care provider. Make sure you discuss any questions you have with your health care provider. Document Revised: 02/10/2018 Document Reviewed: 02/10/2018 Elsevier Patient Education  2020 [place] are being discharged to home.  Resume your previous diet.  Your physician has recommended a repeat colonoscopy in 10 years for screening purposes.  We are waiting for your pathology results.

## 2020-10-02 NOTE — Op Note (Signed)
[facility] Patient Name: Daniel Gibbs Procedure Date: 10/02/2020 7:11 AM MRN: [id number] Date of Birth: January 09, 1986 Attending MD: [name] ,  CSN: [id number] Age: 34 Admit Type: Outpatient Procedure:                Colonoscopy Indications:              Rectal bleeding Providers:                [name], Caprice Kluver, Lambert Mody,                            Randa Spike, Technician Referring MD:              Medicines:                Monitored Anesthesia Care Complications:            No immediate complications. Estimated Blood Loss:     Estimated blood loss: none. Procedure:                Pre-Anesthesia Assessment:                           - Prior to the procedure, a History and Physical                            was performed, and patient medications, allergies                            and sensitivities were reviewed. The patient's                            tolerance of previous anesthesia was reviewed.                           - The risks and benefits of the procedure and the                            sedation options and risks were discussed with the                            patient. All questions were answered and informed                            consent was obtained.                           - ASA Grade Assessment: I - A normal, healthy                            patient.                           After obtaining informed consent, the colonoscope                            was passed under direct vision. Throughout the  procedure, the patient's blood pressure, pulse, and                            oxygen saturations were monitored continuously. The                            [id number]-HQ190L ([id number]) scope was introduced through                            the anus and advanced to the the terminal ileum.                            The colonoscopy was performed without difficulty.                            The patient  tolerated the procedure well. Scope In: 7:41:51 AM Scope Out: 8:02:33 AM Scope Withdrawal Time: 0 hours 17 minutes 33 seconds  Total Procedure Duration: 0 hours 20 minutes 42 seconds  Findings:      The perianal and digital rectal examinations were normal.      The terminal ileum appeared normal.      A 4 mm polyp was found in the transverse colon. The polyp was sessile.       The polyp was removed with a cold snare. Resection and retrieval were       complete.      A few small-mouthed diverticula were found in the sigmoid colon.      Nonbleeding ulcerated mucosa with no stigmata of recent bleeding were       present in the rectum. This was at the site of previous hemorrhoidal       banding. Normal retroflexion. Impression:               - The examined portion of the ileum was normal.                           - One 4 mm polyp in the transverse colon, removed                            with a cold snare. Resected and retrieved.                           - Diverticulosis in the sigmoid colon.                           - Non bleeding mucosal ulceration due to banding. Moderate Sedation:      Per Anesthesia Care Recommendation:           - Discharge patient to home (ambulatory).                           - Resume previous diet.                           - Repeat colonoscopy in 10 years for screening  purposes.                           - Await pathology results. Procedure Code(s):        --- Professional ---                           [phone], GC, Colonoscopy, flexible; with removal of                            tumor(s), polyp(s), or other lesion(s) by snare                            technique Diagnosis Code(s):        --- Professional ---                           K63.5, Polyp of colon                           K63.3, Ulcer of intestine                           K62.5, Hemorrhage of anus and rectum CPT copyright 2019 American Medical Association. All rights  reserved. The codes documented in this report are preliminary and upon coder review may  be revised to meet current compliance requirements. [name], MD [name],  10/02/2020 8:10:31 AM This report has been signed electronically. Number of Addenda: 0

## 2020-10-02 NOTE — Transfer of Care (Signed)
Immediate Anesthesia Transfer of Care Note  Patient: Daniel Gibbs  Procedure(s) Performed: COLONOSCOPY WITH PROPOFOL (N/A ) POLYPECTOMY  Patient Location: PACU  Anesthesia Type:General  Level of Consciousness: drowsy  Airway & Oxygen Therapy: Patient Spontanous Breathing  Post-op Assessment: Report given to RN and Post -op Vital signs reviewed and stable  Post vital signs: Reviewed and stable  Last Vitals:  Vitals Value Taken Time  BP    Temp    Pulse    Resp    SpO2      Last Pain:  Vitals:   10/02/20 0732  TempSrc:   PainSc: 0-No pain      Patients Stated Pain Goal: 5 (54/56/25 6389)  Complications: No complications documented.

## 2020-10-02 NOTE — H&P (Signed)
Daniel Gibbs is an 34 y.o. male.   Chief Complaint: Rectal bleeding HPI: 34 year old male with past medical history of anxiety and hemorrhoids status post ligation, who comes to the hospital for evaluation of rectal bleeding.  Patient reports having episodes of rectal bleeding and dyschezia in the past.  He has been taking MiraLAX, which improve the consistency of his bowel movements but he was presenting persistent rectal bleeding.  Disease, patient was referred to colorectal surgery for banding, which he had performed recently.  Since then he has not presented any more rectal bleeding.  Denies any family history of colorectal cancer.  He denies any nausea, vomiting, fever, chills, abdominal distention or pain, weight loss.  Past Medical History:  Diagnosis Date  . Anxiety   . Constipation   . Diarrhea   . Hemorrhoid   . Nausea     Past Surgical History:  Procedure Laterality Date  . APPENDECTOMY    . Surgery for urinary reflux     on left and right   . WRIST ARTHROSCOPY WITH FOVEAL TRIANGULAR FIBROCARTILAGE COMPLEX REPAIR Right 05/21/2015   Procedure: WRIST ARTHROSCOPY WITH TRIANGULAR FIBROCARTILAGE COMPLEX REPAIR;  Surgeon: Milly Jakob, MD;  Location: [facility];  Service: Orthopedics;  Laterality: Right;  RIGHT WRIST SCOPE WITH TFCC REPAIR.  GENERAL ANESTHESIA WITH PRE-OP BLOCK    Family History  Problem Relation Age of Onset  . Healthy Mother   . Asthma Brother   . Healthy Sister    Social History:  reports that he has never smoked. His smokeless tobacco use includes chew. He reports current alcohol use. He reports that he does not use drugs.  Allergies:  Allergies  Allergen Reactions  . Bee Venom Anaphylaxis  . Sulfate Hives    Medications Prior to Admission  Medication Sig Dispense Refill  . acetaminophen (TYLENOL) 325 MG tablet Take 650 mg by mouth every 6 (six) hours as needed for headache.     . clonazePAM (KLONOPIN) 1 MG tablet Take 1 mg by  mouth at bedtime.     Marland Kitchen EPINEPHrine 0.3 mg/0.3 mL IJ SOAJ injection Inject 0.3 mg into the muscle as needed for anaphylaxis.     Marland Kitchen HYDROCORTISONE ACE, RECTAL, 30 MG SUPP Place 1 suppository rectally 2 (two) times daily as needed (itching/irritation (hemorroids)).     . Multiple Vitamins-Minerals (ADULT ONE DAILY GUMMIES PO) Take 1 tablet by mouth daily. Men's Daily Gummy    . polyethylene glycol (MIRALAX / GLYCOLAX) 17 g packet Take 4.2 g by mouth daily.    . sertraline (ZOLOFT) 25 MG tablet Take 25 mg by mouth daily.    . Carbinoxamine Maleate (RYVENT) 6 MG TABS Take 1 tablet by mouth 2 (two) times daily. (Patient not taking: Reported on 09/26/2020) 30 tablet 5  . hyoscyamine (LEVSIN SL) 0.125 MG SL tablet Place 0.125 mg under the tongue 3 (three) times daily as needed (abdominal pains/spasms).    Marland Kitchen levocetirizine (XYZAL) 5 MG tablet Take 1 tablet (5 mg total) by mouth every evening. (Patient not taking: Reported on 09/26/2020) 30 tablet 12  . linaclotide (LINZESS) 72 MCG capsule Take 1 capsule (72 mcg total) by mouth daily. (Patient not taking: Reported on 09/26/2020) 30 capsule 5  . PLENVU 140 g SOLR Take 140 g by mouth as directed.    . valACYclovir (VALTREX) 1000 MG tablet Take 1,000 mg by mouth 2 (two) times daily as needed (outbreaks).       Results for orders placed or performed  during the hospital encounter of 10/01/20 (from the past 48 hour(s))  SARS CORONAVIRUS 2 (TAT 6-24 HRS) Nasopharyngeal Nasopharyngeal Swab     Status: None   Collection Time: 10/01/20  8:30 AM   Specimen: Nasopharyngeal Swab  Result Value Ref Range   SARS Coronavirus 2 NEGATIVE NEGATIVE    Comment: (NOTE) SARS-CoV-2 target nucleic acids are NOT DETECTED.  The SARS-CoV-2 RNA is generally detectable in upper and lower respiratory specimens during the acute phase of infection. Negative results do not preclude SARS-CoV-2 infection, do not rule out co-infections with other pathogens, and should not be used as  the sole basis for treatment or other patient management decisions. Negative results must be combined with clinical observations, patient history, and epidemiological information. The expected result is Negative.  Fact Sheet for Patients: SugarRoll.be  Fact Sheet for Healthcare Providers: https://www.woods-mathews.com/  This test is not yet approved or cleared by the [place] FDA and  has been authorized for detection and/or diagnosis of SARS-CoV-2 by FDA under an Emergency Use Authorization (EUA). This EUA will remain  in effect (meaning this test can be used) for the duration of the COVID-19 declaration under Se ction 564(b)(1) of the Act, 21 U.S.C. section 360bbb-3(b)(1), unless the authorization is terminated or revoked sooner.  Performed at [facility], [place] [address], [place], [place] [address]    No results found.  Review of Systems  Constitutional: Negative.   HENT: Negative.   Eyes: Negative.   Respiratory: Negative.   Cardiovascular: Negative.   Gastrointestinal: Positive for blood in stool.  Endocrine: Negative.   Genitourinary: Negative.   Skin: Negative.   Allergic/Immunologic: Negative.   Neurological: Negative.   Hematological: Negative.   Psychiatric/Behavioral: Negative.     Blood pressure 115/75, temperature 97.9 F (36.6 C), temperature source Oral, resp. rate 13, height 6' (1.829 m), weight 88.5 kg, SpO2 97 %. Physical Exam  .GENERAL: The patient is AO x3, in no acute distress. HEENT: Head is normocephalic and atraumatic. EOMI are intact. Mouth is well hydrated and without lesions. NECK: Supple. No masses LUNGS: Clear to auscultation. No presence of rhonchi/wheezing/rales. Adequate chest expansion HEART: RRR, normal s1 and s2. ABDOMEN: Soft, nontender, no guarding, no peritoneal signs, and nondistended. BS +. No masses. EXTREMITIES: Without any cyanosis, clubbing, rash, lesions or  edema. NEUROLOGIC: AOx3, no focal motor deficit. SKIN: no jaundice, no rashes  Assessment/Plan 34 year old male with past medical history of anxiety and hemorrhoids status post ligation, who comes to the hospital for evaluation of rectal bleeding.  We will proceed with colonoscopy.  Harvel Quale, MD 10/02/2020, 7:33 AM

## 2020-10-04 LAB — SURGICAL PATHOLOGY

## 2020-10-08 ENCOUNTER — Encounter (HOSPITAL_COMMUNITY): Payer: Self-pay | Admitting: Gastroenterology

## 2020-10-25 ENCOUNTER — Encounter (INDEPENDENT_AMBULATORY_CARE_PROVIDER_SITE_OTHER): Payer: Self-pay | Admitting: *Deleted

## 2022-02-19 ENCOUNTER — Encounter (HOSPITAL_COMMUNITY): Payer: Self-pay | Admitting: Gastroenterology

## 2022-02-19 ENCOUNTER — Ambulatory Visit
Admission: EM | Admit: 2022-02-19 | Discharge: 2022-02-19 | Disposition: A | Discharge status: Home / Self Care | Payer: BC Managed Care – PPO

## 2022-02-19 DIAGNOSIS — J309 Allergic rhinitis, unspecified: Secondary | ICD-10-CM | POA: Diagnosis not present

## 2022-02-19 MED ORDER — DEXAMETHASONE SODIUM PHOSPHATE 10 MG/ML IJ SOLN
10.0000 mg | Freq: Once | INTRAMUSCULAR | Status: AC
  Administered 2022-02-19: 10 mg via INTRAMUSCULAR

## 2022-02-19 MED ORDER — FLUTICASONE PROPIONATE 50 MCG/ACT NA SUSP: 1.0000 | Freq: Two times a day (BID) | NASAL | 2 refills | Status: AC

## 2022-02-19 NOTE — ED Provider Notes (Signed)
?[facility] ? ? ? ?CSN: [id number] ?Arrival date & time: 02/19/22  1036 ? ? ?  ? ?History   ?Chief Complaint ?Chief Complaint  ?Patient presents with  ? Sinusitis  ? ? ?HPI ?Daniel Gibbs is a 36 y.o. male.  ? ?Ending today with 4-day history of progressively worsening sinus drainage, sinus pressure, scratchy throat, ear pressure, cough.  Denies fever, chills, chest pain, shortness of breath, abdominal pain, nausea vomiting or diarrhea.  States symptoms started directly after he mowed the grass and that this happens every single spring during the first mile.  Trying Mucinex, hot tea, Xyzal daily for seasonal allergies with minimal relief. ? ? ?History reviewed. No pertinent past medical history. ? ?There are no problems to display for this patient. ? ? ?History reviewed. No pertinent surgical history. ? ? ? ? ?Home Medications   ? ?Prior to Admission medications   ?Medication Sig Start Date End Date Taking? Authorizing Provider  ?fluticasone (FLONASE) 50 MCG/ACT nasal spray Place 1 spray into both nostrils 2 (two) times daily. 02/19/22  Yes Volney American, PA-C  ?clonazePAM (KLONOPIN) 2 MG tablet Take 2 mg by mouth at bedtime. 02/01/22   [provider]  ?sertraline (ZOLOFT) 25 MG tablet Take 25 mg by mouth daily. 02/02/22   [provider]  ? ? ?Family History ?Family History  ?Problem Relation Age of Onset  ? Healthy Mother   ? ? ?Social History ?Social History  ? ?Tobacco Use  ? Smoking status: Never  ?  Passive exposure: Never  ? Smokeless tobacco: Current  ?  Types: Chew  ?Vaping Use  ? Vaping Use: Never used  ?Substance Use Topics  ? Alcohol use: Yes  ?  Comment: Occa  ? Drug use: Never  ? ? ? ?Allergies   ?Sulfa antibiotics ? ? ?Review of Systems ?Review of Systems ?Per HPI ? ?Physical Exam ?Triage Vital Signs ?ED Triage Vitals  ?Enc Vitals Group  ?   BP 02/19/22 1215 111/76  ?   Pulse Rate 02/19/22 1215 82  ?   Resp 02/19/22 1215 20  ?   Temp 02/19/22 1215 98.6 ?F (37 ?C)  ?    Temp Source 02/19/22 1215 Oral  ?   SpO2 02/19/22 1215 96 %  ?   Weight --   ?   Height --   ?   Head Circumference --   ?   Peak Flow --   ?   Pain Score 02/19/22 1211 4  ?   Pain Loc --   ?   Pain Edu? --   ?   Excl. in [place]? --   ? ?No data found. ? ?Updated Vital Signs ?BP 111/76 (BP Location: Right Arm)   Pulse 82   Temp 98.6 ?F (37 ?C) (Oral)   Resp 20   SpO2 96%  ? ?Visual Acuity ?Right Eye Distance:   ?Left Eye Distance:   ?Bilateral Distance:   ? ?Right Eye Near:   ?Left Eye Near:    ?Bilateral Near:    ? ?Physical Exam ?Vitals and nursing note reviewed.  ?Constitutional:   ?   Appearance: He is well-developed.  ?HENT:  ?   Head: Atraumatic.  ?   Right Ear: External ear normal.  ?   Left Ear: External ear normal.  ?   Nose: Rhinorrhea present.  ?   Mouth/Throat:  ?   Pharynx: Posterior oropharyngeal erythema present. No oropharyngeal exudate.  ?Eyes:  ?   Conjunctiva/sclera:  Conjunctivae normal.  ?   Pupils: Pupils are equal, round, and reactive to light.  ?Cardiovascular:  ?   Rate and Rhythm: Normal rate and regular rhythm.  ?Pulmonary:  ?   Effort: Pulmonary effort is normal. No respiratory distress.  ?   Breath sounds: No wheezing or rales.  ?Musculoskeletal:     ?   General: Normal range of motion.  ?   Cervical back: Normal range of motion and neck supple.  ?Lymphadenopathy:  ?   Cervical: No cervical adenopathy.  ?Skin: ?   General: Skin is warm and dry.  ?Neurological:  ?   Mental Status: He is alert and oriented to person, place, and time.  ?Psychiatric:     ?   Behavior: Behavior normal.  ? ? ? ?UC Treatments / Results  ?Labs ?(all labs ordered are listed, but only abnormal results are displayed) ?Labs Reviewed - No data to display ? ?EKG ? ? ?Radiology ?No results found. ? ?Procedures ?Procedures (including critical care time) ? ?Medications Ordered in UC ?Medications  ?dexamethasone (DECADRON) injection 10 mg (has no administration in time range)  ? ? ?Initial Impression / Assessment and Plan  / UC Course  ?I have reviewed the triage vital signs and the nursing notes. ? ?Pertinent labs & imaging results that were available during my care of the patient were reviewed by me and considered in my medical decision making (see chart for details). ? ?  ? ?Suspect seasonal allergy exacerbation, treat with IM Decadron, Flonase, Xyzal and over-the-counter supportive medications and home care.  Return for worsening symptoms. ?Final Clinical Impressions(s) / UC Diagnoses  ? ?Final diagnoses:  ?Allergic sinusitis  ? ?Discharge Instructions   ?None ?  ? ?ED Prescriptions   ? ? Medication Sig Dispense Auth. Provider  ? fluticasone (FLONASE) 50 MCG/ACT nasal spray Place 1 spray into both nostrils 2 (two) times daily. 16 g Volney American, [place]  ? ?  ? ?PDMP not reviewed this encounter. ?  ?Volney American, PA-C ?02/19/22 1304 ? ?

## 2022-02-19 NOTE — ED Triage Notes (Signed)
Pt states he mowed on Monday and he felt some drainage and it has progressed worse ? ?Pt states he has a lot of drainage in his throat and pain ? ?Pt states that both ears are throbbing ? ?Pt states he tried Mucinex and Hot tea for some relief ? ?Denies Fever ?

## 2022-02-21 ENCOUNTER — Ambulatory Visit
Admission: EM | Admit: 2022-02-21 | Discharge: 2022-02-21 | Disposition: A | Discharge status: Home / Self Care | Payer: BC Managed Care – PPO | Attending: Urgent Care | Admitting: Urgent Care

## 2022-02-21 DIAGNOSIS — J309 Allergic rhinitis, unspecified: Secondary | ICD-10-CM

## 2022-02-21 DIAGNOSIS — J3489 Other specified disorders of nose and nasal sinuses: Secondary | ICD-10-CM | POA: Diagnosis not present

## 2022-02-21 DIAGNOSIS — H938X3 Other specified disorders of ear, bilateral: Secondary | ICD-10-CM | POA: Diagnosis not present

## 2022-02-21 DIAGNOSIS — R070 Pain in throat: Secondary | ICD-10-CM

## 2022-02-21 DIAGNOSIS — R052 Subacute cough: Secondary | ICD-10-CM

## 2022-02-21 LAB — POCT RAPID STREP A (OFFICE): Rapid Strep A Screen: NEGATIVE

## 2022-02-21 MED ORDER — PREDNISONE 50 MG PO TABS: 50.0000 mg | ORAL_TABLET | Freq: Every day | ORAL | 0 refills | Status: DC

## 2022-02-21 MED ORDER — LEVOCETIRIZINE DIHYDROCHLORIDE 5 MG PO TABS: 5.0000 mg | ORAL_TABLET | Freq: Every evening | ORAL | 0 refills | Status: DC

## 2022-02-21 MED ORDER — AMOXICILLIN 875 MG PO TABS: 875.0000 mg | ORAL_TABLET | Freq: Two times a day (BID) | ORAL | 0 refills | Status: DC

## 2022-02-21 NOTE — ED Provider Notes (Signed)
?[place] ? ? ?MRN: [id number] DOB: [birth year] ? ?Subjective:  ? ?Daniel Gibbs is a 36 y.o. male presenting for 5-day history of persistent sinus congestion, postnasal drainage, not feeling throat pain and painful swallowing.  Patient was treated with IM dexamethasone on 02/19/2022.  He had transient improvement for 2 days but then his symptoms returned and feels significantly worse.  Has done really well with prednisone in the past and would like to try this.  No chest pain, shortness of breath or wheezing. ? ?No current facility-administered medications for this encounter. ? ?Current Outpatient Medications:  ?  acetaminophen (TYLENOL) 325 MG tablet, Take 650 mg by mouth every 6 (six) hours as needed for headache. , Disp: , Rfl:  ?  Carbinoxamine Maleate (RYVENT) 6 MG TABS, Take 1 tablet by mouth 2 (two) times daily. (Patient not taking: Reported on 09/26/2020), Disp: 30 tablet, Rfl: 5 ?  clonazePAM (KLONOPIN) 1 MG tablet, Take 1 mg by mouth at bedtime. , Disp: , Rfl:  ?  clonazePAM (KLONOPIN) 2 MG tablet, Take 2 mg by mouth at bedtime., Disp: , Rfl:  ?  EPINEPHrine 0.3 mg/0.3 mL IJ SOAJ injection, Inject 0.3 mg into the muscle as needed for anaphylaxis. , Disp: , Rfl:  ?  fluticasone (FLONASE) 50 MCG/ACT nasal spray, Place 1 spray into both nostrils 2 (two) times daily., Disp: 16 g, Rfl: 2 ?  HYDROCORTISONE ACE, RECTAL, 30 MG SUPP, Place 1 suppository rectally 2 (two) times daily as needed (itching/irritation (hemorroids)). , Disp: , Rfl:  ?  hyoscyamine (LEVSIN SL) 0.125 MG SL tablet, Place 0.125 mg under the tongue 3 (three) times daily as needed (abdominal pains/spasms)., Disp: , Rfl:  ?  levocetirizine (XYZAL) 5 MG tablet, Take 1 tablet (5 mg total) by mouth every evening. (Patient not taking: Reported on 09/26/2020), Disp: 30 tablet, Rfl: 12 ?  linaclotide (LINZESS) 72 MCG capsule, Take 1 capsule (72 mcg total) by mouth daily. (Patient not taking: Reported on 09/26/2020), Disp: 30  capsule, Rfl: 5 ?  Multiple Vitamins-Minerals (ADULT ONE DAILY GUMMIES PO), Take 1 tablet by mouth daily. Men's Daily Gummy, Disp: , Rfl:  ?  polyethylene glycol (MIRALAX / GLYCOLAX) 17 g packet, Take 4.2 g by mouth daily., Disp: , Rfl:  ?  sertraline (ZOLOFT) 25 MG tablet, Take 25 mg by mouth daily., Disp: , Rfl:  ?  sertraline (ZOLOFT) 25 MG tablet, Take 25 mg by mouth daily., Disp: , Rfl:  ?  valACYclovir (VALTREX) 1000 MG tablet, Take 1,000 mg by mouth 2 (two) times daily as needed (outbreaks). , Disp: , Rfl:   ? ?Allergies  ?Allergen Reactions  ? Bee Venom Anaphylaxis  ? Sulfa Antibiotics Hives  ? Sulfate Hives  ? ? ?Past Medical History:  ?Diagnosis Date  ? Anxiety   ? Constipation   ? Diarrhea   ? Hemorrhoid   ? Nausea   ?  ? ?Past Surgical History:  ?Procedure Laterality Date  ? APPENDECTOMY    ? COLONOSCOPY WITH PROPOFOL N/A 10/02/2020  ? Procedure: COLONOSCOPY WITH PROPOFOL;  Surgeon: Harvel Quale, MD;  [facility];  Service: Gastroenterology;  Laterality: N/A;  8:30, per office, pt knows new arrival time  ? POLYPECTOMY  10/02/2020  ? Procedure: POLYPECTOMY;  Surgeon: Harvel Quale, MD;  Location: [facility];  Service: Gastroenterology;;  ? Surgery for urinary reflux    ? on left and right   ? WRIST ARTHROSCOPY WITH FOVEAL TRIANGULAR FIBROCARTILAGE COMPLEX REPAIR Right  05/21/2015  ? Procedure: WRIST ARTHROSCOPY WITH TRIANGULAR FIBROCARTILAGE COMPLEX REPAIR;  Surgeon: Milly Jakob, MD;  Location: [place];  Service: Orthopedics;  Laterality: Right;  RIGHT WRIST SCOPE WITH TFCC REPAIR. ? ?GENERAL ANESTHESIA WITH PRE-OP BLOCK  ? ? ?Family History  ?Problem Relation Age of Onset  ? Healthy Mother   ? Asthma Brother   ? Healthy Sister   ? ? ?Social History  ? ?Tobacco Use  ? Smoking status: Never  ?  Passive exposure: Never  ? Smokeless tobacco: Current  ?  Types: Chew  ?Vaping Use  ? Vaping Use: Never used  ?Substance Use Topics  ? Alcohol use: Yes  ?   Comment: Occa  ? Drug use: Never  ? ? ?ROS ? ? ?Objective:  ? ?Vitals: ?BP 127/76 (BP Location: Right Arm)   Pulse 93   Temp 97.9 ?F (36.6 ?C) (Oral)   Resp 18   SpO2 96%  ? ?Physical Exam ?Constitutional:   ?   General: He is not in acute distress. ?   Appearance: Normal appearance. He is well-developed and normal weight. He is not ill-appearing, toxic-appearing or diaphoretic.  ?HENT:  ?   Head: Normocephalic and atraumatic.  ?   Right Ear: Tympanic membrane, ear canal and external ear normal. There is no impacted cerumen.  ?   Left Ear: Tympanic membrane, ear canal and external ear normal. There is no impacted cerumen.  ?   Nose: Congestion present. No rhinorrhea.  ?   Mouth/Throat:  ?   Mouth: Mucous membranes are moist.  ?   Pharynx: Posterior oropharyngeal erythema present. No oropharyngeal exudate.  ?   Comments: With associate post-nasal drainage in thick streaks.  ?Eyes:  ?   General: No scleral icterus.    ?   Right eye: No discharge.     ?   Left eye: No discharge.  ?   Extraocular Movements: Extraocular movements intact.  ?   Conjunctiva/sclera: Conjunctivae normal.  ?Cardiovascular:  ?   Rate and Rhythm: Normal rate and regular rhythm.  ?   Heart sounds: Normal heart sounds. No murmur heard. ?  No friction rub. No gallop.  ?Pulmonary:  ?   Effort: Pulmonary effort is normal. No respiratory distress.  ?   Breath sounds: Normal breath sounds. No stridor. No wheezing, rhonchi or rales.  ?Musculoskeletal:  ?   Cervical back: Normal range of motion and neck supple. No rigidity. No muscular tenderness.  ?Neurological:  ?   General: No focal deficit present.  ?   Mental Status: He is alert and oriented to person, place, and time.  ?Psychiatric:     ?   Mood and Affect: Mood normal.     ?   Behavior: Behavior normal.     ?   Thought Content: Thought content normal.  ? ? ?Results for orders placed or performed during the hospital encounter of 02/21/22 (from the past 24 hour(s))  ?POCT rapid strep A      Status: None  ? Collection Time: 02/21/22  3:20 PM  ?Result Value Ref Range  ? Rapid Strep A Screen Negative Negative  ? ? ?Assessment and Plan :  ? ?PDMP not reviewed this encounter. ? ?1. Allergic rhinitis, unspecified seasonality, unspecified trigger   ?2. Throat pain   ?3. Subacute cough   ?4. Sinus pain   ?5. Ear fullness, bilateral   ? ?Deferred imaging given clear cardiopulmonary exam, hemodynamically stable vital signs. I will be using oral  prednisone as I do believe that patient would benefit from this as well for his allergic rhinitis.  I did provide him with a prescription for amoxicillin in the event that he ends up having lingering symptoms, sinus pain.  Amoxicillin would address a secondary sinusitis.  Recommended taking Xyzal long-term.  Hold Flonase for now.  Use pseudoephedrine following the prednisone course as needed. Counseled patient on potential for adverse effects with medications prescribed/recommended today, ER and return-to-clinic precautions discussed, patient verbalized understanding. ? ?  ?Jaynee Eagles, PA-C ?02/21/22 1609 ? ?

## 2022-02-21 NOTE — ED Triage Notes (Signed)
Pt reports nasal congestion, throat feels funky, cough started this morning. States he was treated for sinus infection on 02/19/22 and felt better after a shot he had.  ?

## 2023-02-15 ENCOUNTER — Telehealth: Payer: Self-pay | Admitting: Allergy & Immunology

## 2023-02-15 DIAGNOSIS — J3081 Allergic rhinitis due to animal (cat) (dog) hair and dander: Secondary | ICD-10-CM | POA: Diagnosis not present

## 2023-02-15 NOTE — Telephone Encounter (Signed)
Patient's wife called requesting to restart allergy injections. He has been off of them for a few years now. He has been scheduled for an office visit and a new start appointment for May 3rd.  Immunotherapy Team - please remix the previous script  Daniel Bonds, MD [facility]

## 2023-02-15 NOTE — Progress Notes (Signed)
VIALS EXP 02-15-24 

## 2023-03-12 ENCOUNTER — Encounter: Payer: Self-pay | Admitting: Allergy & Immunology

## 2023-03-12 ENCOUNTER — Ambulatory Visit: Payer: BC Managed Care – PPO

## 2023-03-12 ENCOUNTER — Ambulatory Visit (INDEPENDENT_AMBULATORY_CARE_PROVIDER_SITE_OTHER): Payer: 59 | Admitting: Allergy & Immunology

## 2023-03-12 ENCOUNTER — Other Ambulatory Visit: Payer: Self-pay

## 2023-03-12 VITALS — BP 118/84 | HR 78 | Temp 97.7°F | Resp 18 | Ht 73.0 in | Wt 241.1 lb

## 2023-03-12 DIAGNOSIS — J302 Other seasonal allergic rhinitis: Secondary | ICD-10-CM

## 2023-03-12 DIAGNOSIS — J309 Allergic rhinitis, unspecified: Secondary | ICD-10-CM

## 2023-03-12 MED ORDER — RYALTRIS 665-25 MCG/ACT NA SUSP: 1.0000 | Freq: Two times a day (BID) | NASAL | 5 refills | Status: DC

## 2023-03-12 MED ORDER — CARBINOXAMINE MALEATE 6 MG PO TABS: 6.0000 mg | ORAL_TABLET | Freq: Two times a day (BID) | ORAL | 1 refills | Status: DC

## 2023-03-12 MED ORDER — EPINEPHRINE 0.3 MG/0.3ML IJ SOAJ: 0.3000 mg | INTRAMUSCULAR | 2 refills | Status: DC | PRN

## 2023-03-12 NOTE — Progress Notes (Unsigned)
Immunotherapy   Patient Details  Name: Daniel Gibbs MRN: [id number] Date of Birth: 11-03-1986  03/12/2023  [name] started injections for  molds, cockroaches, pollens, and cats.  Following schedule: B  Frequency:2 times per week Epi-Pen:Epi-Pen Available  Consent signed in office today and patient instructions given. Patient sat in room four for thirty minutes without an issue.    Ralene Muskrat 03/12/2023, 4:43 PM

## 2023-03-12 NOTE — Progress Notes (Unsigned)
FOLLOW UP  Date of Service/Encounter:  03/12/23   Assessment:   Seasonal and perennial allergic rhinitis (grasses, weeds, trees, molds, cockroach, and cat)   Psoriasis - controlled with triamcinolone twice daily PRN   Vaccinated to COVID19  Plan/Recommendations:   1. Chronic allergic rhinitis (grasses, weeds, trees, molds, and cat) - Allergy shots restarted today.  - We will add epinephrine rinses to decrease the swelling around the injection sites.  - Start carbinoxamine 6 mg twice daily (call us if this is not covered well).  - Start Ryaltris one spray per nostril twice daily (sample provided). - This will go to a specialty pharmacy and they will call or text to discuss delivery options.   2. Psoriasis - This seems to be resolved.   3. Return in about 6 months (around 09/12/2023).   Subjective:   Daniel Gibbs is a 37 y.o. male presenting today for follow up of  Chief Complaint  Patient presents with   Follow-up    Daniel Gibbs has a history of the following: Patient Active Problem List   Diagnosis Date Noted   IBS (irritable bowel syndrome) 05/20/2020   Hemorrhoids 05/20/2020   Seasonal and perennial allergic rhinitis 07/06/2017   Acute bronchitis 01/11/2017   Cough 01/11/2017    History obtained from: chart review and patient.  Daniel Gibbs is 3 Bo the Daniel Gibbs is their dog.   Key is a 37 y.o. male presenting for a follow up visit.  He was last seen in June 2021.  At that time, we restarted his allergy shots.  We started him on the Silver vial.  He continue with Xyzal 5 mg 1-2 times daily as well as Dymista 2 sprays per nostril up to twice daily.  For his psoriasis, we continue with the triamcinolone ointment to be used twice daily as needed.  In the interim, he stopped his allergy shots again we have not seen him since August 2021.  Allergic Rhinitis Symptom History: He really like using the Rycent, but this has been covered well since 2018 or so. This  has been helped to his postnasal drip as well as allergic rhinitis. He does have  antihistamines that he uses daily. He does use Flonase occasionally. We did have him on Dymista, which worked but he did not like the taste of it, so he never ended up taking it at all. He has not been on antibiotics at all for sinus infections.   Daniel Gibbs is on allergen immunotherapy. He receives two injections. Immunotherapy script #1 contains trees, weeds, grasses and cat. He currently receives 0.37mL of the BLUE vial (1/100,000). Immunotherapy script #2 contains molds and cockroach. He currently receives 0.18mL of the BLUE vial (1/100,000). He started shots June of 2018 and not yet reached maintenance. We did add epinephrine rinses due to his history of large local reactions.   He has put on some weight. He is looking to join a gym. He is working exclusively from home, which is why he has put on the weight. He is still Radio producer.   Otherwise, there have been no changes to his past medical history, surgical history, family history, or social history.    Review of Systems  Constitutional: Negative.  Negative for chills, fever, malaise/fatigue and weight loss.  HENT:  Positive for congestion and sinus pain. Negative for ear discharge and ear pain.        Positive for postnasal drip.  Positive for throat clearing.  Eyes:  Negative  for pain, discharge and redness.  Respiratory:  Negative for cough, sputum production, shortness of breath and wheezing.   Cardiovascular: Negative.  Negative for chest pain and palpitations.  Gastrointestinal:  Negative for abdominal pain, constipation, diarrhea, heartburn, nausea and vomiting.  Skin: Negative.  Negative for itching and rash.  Neurological:  Negative for dizziness and headaches.  Endo/Heme/Allergies:  Positive for environmental allergies. Does not bruise/bleed easily.       Objective:   Blood pressure 118/84, pulse 78, temperature 97.7 F (36.5 C), resp.  rate 18, height 6\' 1"  (1.854 m), weight 241 lb 2 oz (109.4 kg), SpO2 97 %. Body mass index is 31.81 kg/m.    Physical Exam Vitals reviewed.  Constitutional:      Appearance: He is well-developed.     Comments: Talkative.   HENT:     Head: Normocephalic and atraumatic.     Right Ear: Tympanic membrane, ear canal and external ear normal.     Left Ear: Tympanic membrane, ear canal and external ear normal.     Nose: No nasal deformity, septal deviation, mucosal edema or rhinorrhea.     Right Turbinates: Enlarged, swollen and pale.     Left Turbinates: Enlarged, swollen and pale.     Right Sinus: No maxillary sinus tenderness or frontal sinus tenderness.     Left Sinus: No maxillary sinus tenderness or frontal sinus tenderness.     Comments: No nasal polyps noted.     Mouth/Throat:     Mouth: Mucous membranes are not pale and not dry.     Pharynx: Uvula midline.  Eyes:     General: Lids are normal. No allergic shiner.       Right eye: No discharge.        Left eye: No discharge.     Conjunctiva/sclera: Conjunctivae normal.     Right eye: Right conjunctiva is not injected. No chemosis.    Left eye: Left conjunctiva is not injected. No chemosis.    Pupils: Pupils are equal, round, and reactive to light.  Cardiovascular:     Rate and Rhythm: Normal rate and regular rhythm.     Heart sounds: Normal heart sounds.  Pulmonary:     Effort: Pulmonary effort is normal. No tachypnea, accessory muscle usage or respiratory distress.     Breath sounds: Normal breath sounds. No wheezing, rhonchi or rales.  Chest:     Chest wall: No tenderness.  Lymphadenopathy:     Cervical: No cervical adenopathy.  Skin:    Coloration: Skin is not pale.     Findings: No abrasion, erythema, petechiae or rash. Rash is not papular, urticarial or vesicular.  Neurological:     Mental Status: He is alert.  Psychiatric:        Behavior: Behavior is cooperative.      Diagnostic studies: none       Malachi Bonds, MD  [facility]

## 2023-03-12 NOTE — Patient Instructions (Addendum)
1. Chronic allergic rhinitis (grasses, weeds, trees, molds, and cat) - Allergy shots restarted today.  - We will add epinephrine rinses to decrease the swelling around the injection sites.  - Start carbinoxamine 6 mg twice daily (call us if this is not covered well).  - Start Ryaltris one spray per nostril twice daily (sample provided). - This will go to a specialty pharmacy and they will call or text to discuss delivery options.   2. Psoriasis - This seems to be resolved.   3. Return in about 6 months (around 09/12/2023).    Please inform us of any Emergency Department visits, hospitalizations, or changes in symptoms. Call us before going to the ED for breathing or allergy symptoms since we might be able to fit you in for a sick visit. Feel free to contact us anytime with any questions, problems, or concerns.  It was a pleasure to see you again today!  Websites that have reliable patient information: 1. American Academy of Asthma, Allergy, and Immunology: www.aaaai.org 2. Food Allergy Research and Education (FARE): foodallergy.org 3. Mothers of Asthmatics: http://www.asthmacommunitynetwork.org 4. American College of Allergy, Asthma, and Immunology: www.acaai.org   COVID-19 Vaccine Information can be found at: PodExchange.nl For questions related to vaccine distribution or appointments, please email [email or web address] or call [phone].     "Like" [place] on Facebook and Instagram for our latest updates!        Make sure you are registered to vote! If you have moved or changed any of your contact information, you will need to get this updated before voting!  In some cases, you MAY be able to register to vote online: [email or web address]

## 2023-03-14 ENCOUNTER — Encounter: Payer: Self-pay | Admitting: Allergy & Immunology

## 2023-03-17 ENCOUNTER — Ambulatory Visit (INDEPENDENT_AMBULATORY_CARE_PROVIDER_SITE_OTHER): Payer: 59

## 2023-03-17 DIAGNOSIS — J309 Allergic rhinitis, unspecified: Secondary | ICD-10-CM

## 2023-03-17 MED ORDER — CARBINOXAMINE MALEATE 4 MG PO TABS: 1.0000 | ORAL_TABLET | Freq: Two times a day (BID) | ORAL | 5 refills | Status: DC

## 2023-03-17 NOTE — Addendum Note (Signed)
Addended by: Robet Leu A on: 03/17/2023 03:01 PM   Modules accepted: Orders

## 2023-03-18 DIAGNOSIS — J302 Other seasonal allergic rhinitis: Secondary | ICD-10-CM | POA: Diagnosis not present

## 2023-03-19 ENCOUNTER — Ambulatory Visit (INDEPENDENT_AMBULATORY_CARE_PROVIDER_SITE_OTHER): Payer: 59

## 2023-03-19 DIAGNOSIS — J309 Allergic rhinitis, unspecified: Secondary | ICD-10-CM

## 2023-03-24 ENCOUNTER — Ambulatory Visit (INDEPENDENT_AMBULATORY_CARE_PROVIDER_SITE_OTHER): Payer: 59

## 2023-03-24 DIAGNOSIS — J309 Allergic rhinitis, unspecified: Secondary | ICD-10-CM

## 2023-03-31 ENCOUNTER — Ambulatory Visit (INDEPENDENT_AMBULATORY_CARE_PROVIDER_SITE_OTHER): Payer: 59

## 2023-03-31 DIAGNOSIS — J309 Allergic rhinitis, unspecified: Secondary | ICD-10-CM

## 2023-04-02 ENCOUNTER — Ambulatory Visit (INDEPENDENT_AMBULATORY_CARE_PROVIDER_SITE_OTHER): Payer: 59

## 2023-04-02 DIAGNOSIS — J309 Allergic rhinitis, unspecified: Secondary | ICD-10-CM | POA: Diagnosis not present

## 2023-04-07 ENCOUNTER — Ambulatory Visit (INDEPENDENT_AMBULATORY_CARE_PROVIDER_SITE_OTHER): Payer: 59

## 2023-04-07 DIAGNOSIS — J309 Allergic rhinitis, unspecified: Secondary | ICD-10-CM | POA: Diagnosis not present

## 2023-04-09 ENCOUNTER — Ambulatory Visit (INDEPENDENT_AMBULATORY_CARE_PROVIDER_SITE_OTHER): Payer: 59

## 2023-04-09 DIAGNOSIS — J309 Allergic rhinitis, unspecified: Secondary | ICD-10-CM

## 2023-04-14 ENCOUNTER — Ambulatory Visit (INDEPENDENT_AMBULATORY_CARE_PROVIDER_SITE_OTHER): Payer: 59

## 2023-04-14 DIAGNOSIS — J309 Allergic rhinitis, unspecified: Secondary | ICD-10-CM

## 2023-04-16 ENCOUNTER — Ambulatory Visit (INDEPENDENT_AMBULATORY_CARE_PROVIDER_SITE_OTHER): Payer: 59

## 2023-04-16 DIAGNOSIS — J309 Allergic rhinitis, unspecified: Secondary | ICD-10-CM | POA: Diagnosis not present

## 2023-04-21 ENCOUNTER — Ambulatory Visit (INDEPENDENT_AMBULATORY_CARE_PROVIDER_SITE_OTHER): Payer: 59

## 2023-04-21 DIAGNOSIS — J309 Allergic rhinitis, unspecified: Secondary | ICD-10-CM

## 2023-04-23 ENCOUNTER — Ambulatory Visit (INDEPENDENT_AMBULATORY_CARE_PROVIDER_SITE_OTHER): Payer: 59

## 2023-04-23 DIAGNOSIS — J309 Allergic rhinitis, unspecified: Secondary | ICD-10-CM

## 2023-04-28 ENCOUNTER — Ambulatory Visit (INDEPENDENT_AMBULATORY_CARE_PROVIDER_SITE_OTHER): Payer: 59

## 2023-04-28 DIAGNOSIS — J309 Allergic rhinitis, unspecified: Secondary | ICD-10-CM | POA: Diagnosis not present

## 2023-05-05 ENCOUNTER — Ambulatory Visit (INDEPENDENT_AMBULATORY_CARE_PROVIDER_SITE_OTHER): Payer: 59

## 2023-05-05 DIAGNOSIS — J309 Allergic rhinitis, unspecified: Secondary | ICD-10-CM | POA: Diagnosis not present

## 2023-05-07 ENCOUNTER — Ambulatory Visit (INDEPENDENT_AMBULATORY_CARE_PROVIDER_SITE_OTHER): Payer: 59

## 2023-05-07 DIAGNOSIS — J309 Allergic rhinitis, unspecified: Secondary | ICD-10-CM

## 2023-05-12 ENCOUNTER — Other Ambulatory Visit: Payer: Self-pay

## 2023-05-12 ENCOUNTER — Encounter (HOSPITAL_COMMUNITY): Payer: Self-pay | Admitting: Emergency Medicine

## 2023-05-12 ENCOUNTER — Emergency Department (HOSPITAL_COMMUNITY): Payer: 59

## 2023-05-12 ENCOUNTER — Telehealth: Payer: Self-pay

## 2023-05-12 ENCOUNTER — Ambulatory Visit (INDEPENDENT_AMBULATORY_CARE_PROVIDER_SITE_OTHER): Payer: 59

## 2023-05-12 ENCOUNTER — Emergency Department (HOSPITAL_COMMUNITY)
Admission: EM | Admit: 2023-05-12 | Discharge: 2023-05-12 | Discharge status: Against Medical Advice | Payer: 59 | Attending: Emergency Medicine | Admitting: Emergency Medicine

## 2023-05-12 DIAGNOSIS — Z5321 Procedure and treatment not carried out due to patient leaving prior to being seen by health care provider: Secondary | ICD-10-CM | POA: Insufficient documentation

## 2023-05-12 DIAGNOSIS — L299 Pruritus, unspecified: Secondary | ICD-10-CM | POA: Diagnosis not present

## 2023-05-12 DIAGNOSIS — R0602 Shortness of breath: Secondary | ICD-10-CM | POA: Insufficient documentation

## 2023-05-12 DIAGNOSIS — J309 Allergic rhinitis, unspecified: Secondary | ICD-10-CM

## 2023-05-12 DIAGNOSIS — T782XXD Anaphylactic shock, unspecified, subsequent encounter: Secondary | ICD-10-CM

## 2023-05-12 MED ORDER — ALBUTEROL SULFATE (2.5 MG/3ML) 0.083% IN NEBU: 2.5000 mg | INHALATION_SOLUTION | RESPIRATORY_TRACT | Status: DC | PRN

## 2023-05-12 MED ORDER — ALBUTEROL SULFATE HFA 108 (90 BASE) MCG/ACT IN AERS: 2.0000 | INHALATION_SPRAY | RESPIRATORY_TRACT | Status: DC | PRN

## 2023-05-12 NOTE — ED Triage Notes (Signed)
Pt via POV c/o possible allergic reaction after receiving his allergy shots at the clinic this afternoon. Pt notes that he feels itchy and SOB and that it feels like his throat is swollen. No obvious rash noted on assessment but pt seems anxious. O2 97% on room air and HR 81 in triage.

## 2023-05-12 NOTE — Telephone Encounter (Signed)
Patients wife called and stated that patient got his allergy injection today and was injection with his green vial of 0.3 with epi rinses. Wife expressed that now that he is home he has broken out in hives and now starting to have chest pain. After verbally discussing symptoms with Dr. Dellis Anes patients wife was instructed to inject him with his epipen and heas to the hospital. Wife verbalized understanding and agreed to do so.

## 2023-05-17 NOTE — Telephone Encounter (Signed)
I reached out the patient on Wednesday evening and they were in the ED. He did NOT give himself epinephrine.   I reached out the following day and he had ended up leaving since he seems to improve over the course of waiting for a few hours in the ED. They were rather busy and he was not roomed, so they just left. He was feeling achy, but otherwise was improved.   Let's go back to the beginning of the [employer] and change him to Schedule A.   Malachi Bonds, MD [facility]

## 2023-05-19 ENCOUNTER — Ambulatory Visit (INDEPENDENT_AMBULATORY_CARE_PROVIDER_SITE_OTHER): Payer: 59

## 2023-05-19 DIAGNOSIS — J309 Allergic rhinitis, unspecified: Secondary | ICD-10-CM | POA: Diagnosis not present

## 2023-05-21 ENCOUNTER — Ambulatory Visit (INDEPENDENT_AMBULATORY_CARE_PROVIDER_SITE_OTHER): Payer: 59

## 2023-05-21 DIAGNOSIS — J309 Allergic rhinitis, unspecified: Secondary | ICD-10-CM | POA: Diagnosis not present

## 2023-05-25 NOTE — Addendum Note (Signed)
Addended by: Alfonse Spruce on: 05/25/2023 07:38 AM   Modules accepted: Orders

## 2023-05-25 NOTE — Telephone Encounter (Signed)
I am going to oder a tryptase to look for mast cell disease. Although this is all within the realm of normal with allergy shots, we should probably make sure that the tryptase is normal.   No fasting requirements needed. Please let the patient know.   Malachi Bonds, MD [facility]

## 2023-05-25 NOTE — Telephone Encounter (Signed)
Called and spoke to patient and notified him of the note per Dr. Dellis Anes. Patient stated that he will pick up the lab orders as well as the map in the [place] office tomorrow when he gets his injection.

## 2023-05-25 NOTE — Telephone Encounter (Signed)
 Lm for pt to call us back about this °

## 2023-05-26 ENCOUNTER — Ambulatory Visit (INDEPENDENT_AMBULATORY_CARE_PROVIDER_SITE_OTHER): Payer: 59

## 2023-05-26 DIAGNOSIS — J309 Allergic rhinitis, unspecified: Secondary | ICD-10-CM

## 2023-05-28 ENCOUNTER — Ambulatory Visit (INDEPENDENT_AMBULATORY_CARE_PROVIDER_SITE_OTHER): Payer: 59

## 2023-05-28 DIAGNOSIS — J309 Allergic rhinitis, unspecified: Secondary | ICD-10-CM

## 2023-05-28 LAB — TRYPTASE: Tryptase: 8.3 ug/L (ref 2.2–13.2)

## 2023-06-02 ENCOUNTER — Ambulatory Visit (INDEPENDENT_AMBULATORY_CARE_PROVIDER_SITE_OTHER): Payer: 59

## 2023-06-02 DIAGNOSIS — J309 Allergic rhinitis, unspecified: Secondary | ICD-10-CM

## 2023-06-16 ENCOUNTER — Ambulatory Visit (INDEPENDENT_AMBULATORY_CARE_PROVIDER_SITE_OTHER): Payer: 59

## 2023-06-16 DIAGNOSIS — J309 Allergic rhinitis, unspecified: Secondary | ICD-10-CM | POA: Diagnosis not present

## 2023-06-18 ENCOUNTER — Ambulatory Visit (INDEPENDENT_AMBULATORY_CARE_PROVIDER_SITE_OTHER): Payer: 59

## 2023-06-18 DIAGNOSIS — J309 Allergic rhinitis, unspecified: Secondary | ICD-10-CM

## 2023-06-23 ENCOUNTER — Ambulatory Visit (INDEPENDENT_AMBULATORY_CARE_PROVIDER_SITE_OTHER): Payer: 59

## 2023-06-23 DIAGNOSIS — J309 Allergic rhinitis, unspecified: Secondary | ICD-10-CM

## 2023-06-25 ENCOUNTER — Ambulatory Visit (INDEPENDENT_AMBULATORY_CARE_PROVIDER_SITE_OTHER): Payer: 59

## 2023-06-25 DIAGNOSIS — J309 Allergic rhinitis, unspecified: Secondary | ICD-10-CM

## 2023-06-30 ENCOUNTER — Ambulatory Visit (INDEPENDENT_AMBULATORY_CARE_PROVIDER_SITE_OTHER): Payer: 59

## 2023-06-30 DIAGNOSIS — J309 Allergic rhinitis, unspecified: Secondary | ICD-10-CM | POA: Diagnosis not present

## 2023-07-02 ENCOUNTER — Ambulatory Visit (INDEPENDENT_AMBULATORY_CARE_PROVIDER_SITE_OTHER): Payer: 59

## 2023-07-02 DIAGNOSIS — J309 Allergic rhinitis, unspecified: Secondary | ICD-10-CM | POA: Diagnosis not present

## 2023-07-07 ENCOUNTER — Ambulatory Visit (INDEPENDENT_AMBULATORY_CARE_PROVIDER_SITE_OTHER): Payer: 59

## 2023-07-07 ENCOUNTER — Telehealth: Payer: Self-pay | Admitting: Allergy & Immunology

## 2023-07-07 DIAGNOSIS — J309 Allergic rhinitis, unspecified: Secondary | ICD-10-CM

## 2023-07-07 NOTE — Telephone Encounter (Signed)
Patient came in stating he needed a print out of all of his payments from June 26th to August the 15th.

## 2023-07-09 ENCOUNTER — Ambulatory Visit (INDEPENDENT_AMBULATORY_CARE_PROVIDER_SITE_OTHER): Payer: 59

## 2023-07-09 DIAGNOSIS — J309 Allergic rhinitis, unspecified: Secondary | ICD-10-CM

## 2023-07-14 ENCOUNTER — Ambulatory Visit (INDEPENDENT_AMBULATORY_CARE_PROVIDER_SITE_OTHER): Payer: 59 | Admitting: Allergy & Immunology

## 2023-07-14 DIAGNOSIS — J309 Allergic rhinitis, unspecified: Secondary | ICD-10-CM

## 2023-07-21 ENCOUNTER — Ambulatory Visit (INDEPENDENT_AMBULATORY_CARE_PROVIDER_SITE_OTHER): Payer: 59

## 2023-07-21 DIAGNOSIS — J309 Allergic rhinitis, unspecified: Secondary | ICD-10-CM | POA: Diagnosis not present

## 2023-07-28 ENCOUNTER — Ambulatory Visit (INDEPENDENT_AMBULATORY_CARE_PROVIDER_SITE_OTHER): Payer: 59

## 2023-07-28 DIAGNOSIS — J309 Allergic rhinitis, unspecified: Secondary | ICD-10-CM | POA: Diagnosis not present

## 2023-08-18 ENCOUNTER — Ambulatory Visit (INDEPENDENT_AMBULATORY_CARE_PROVIDER_SITE_OTHER): Payer: 59

## 2023-08-18 DIAGNOSIS — J309 Allergic rhinitis, unspecified: Secondary | ICD-10-CM

## 2023-08-25 ENCOUNTER — Ambulatory Visit (INDEPENDENT_AMBULATORY_CARE_PROVIDER_SITE_OTHER): Payer: 59

## 2023-08-25 DIAGNOSIS — J309 Allergic rhinitis, unspecified: Secondary | ICD-10-CM | POA: Diagnosis not present

## 2023-09-01 ENCOUNTER — Ambulatory Visit (INDEPENDENT_AMBULATORY_CARE_PROVIDER_SITE_OTHER): Payer: 59

## 2023-09-01 DIAGNOSIS — J309 Allergic rhinitis, unspecified: Secondary | ICD-10-CM

## 2023-09-08 ENCOUNTER — Ambulatory Visit (INDEPENDENT_AMBULATORY_CARE_PROVIDER_SITE_OTHER): Payer: 59

## 2023-09-08 DIAGNOSIS — J309 Allergic rhinitis, unspecified: Secondary | ICD-10-CM

## 2023-09-15 ENCOUNTER — Ambulatory Visit (INDEPENDENT_AMBULATORY_CARE_PROVIDER_SITE_OTHER): Payer: 59

## 2023-09-15 DIAGNOSIS — J309 Allergic rhinitis, unspecified: Secondary | ICD-10-CM | POA: Diagnosis not present

## 2023-09-24 ENCOUNTER — Ambulatory Visit: Payer: 59 | Admitting: Allergy & Immunology

## 2023-09-24 ENCOUNTER — Encounter: Payer: Self-pay | Admitting: Allergy & Immunology

## 2023-09-24 ENCOUNTER — Other Ambulatory Visit: Payer: Self-pay

## 2023-09-24 VITALS — BP 120/84 | HR 87 | Temp 98.3°F | Resp 18 | Ht 72.05 in | Wt 238.6 lb

## 2023-09-24 DIAGNOSIS — J3089 Other allergic rhinitis: Secondary | ICD-10-CM

## 2023-09-24 DIAGNOSIS — J302 Other seasonal allergic rhinitis: Secondary | ICD-10-CM | POA: Diagnosis not present

## 2023-09-24 NOTE — Patient Instructions (Addendum)
1. Chronic allergic rhinitis (grasses, weeds, trees, molds, and cat) - We are going to continue with the allergy shots at the same schedule.  - We will continue with carbinoxamine 6 mg 1-2 times daily as you are doing.  - We will continue with Flonase one spray per nostril twice daily as needed.  - This will go to a specialty pharmacy and they will call or text to discuss delivery options.   2. Psoriasis - This seems to be resolved.   3. Return in about 1 year (around 09/23/2024).    Please inform us of any Emergency Department visits, hospitalizations, or changes in symptoms. Call us before going to the ED for breathing or allergy symptoms since we might be able to fit you in for a sick visit. Feel free to contact us anytime with any questions, problems, or concerns.  It was a pleasure to see you again today!  Websites that have reliable patient information: 1. American Academy of Asthma, Allergy, and Immunology: www.aaaai.org 2. Food Allergy Research and Education (FARE): foodallergy.org 3. Mothers of Asthmatics: http://www.asthmacommunitynetwork.org 4. American College of Allergy, Asthma, and Immunology: www.acaai.org   COVID-19 Vaccine Information can be found at: PodExchange.[email or web address] For questions related to vaccine distribution or appointments, please email [email or web address] or call [phone].     "Like" [place] on Facebook and Instagram for our latest updates!        Make sure you are registered to vote! If you have moved or changed any of your contact information, you will need to get this updated before voting!  In some cases, you MAY be able to register to vote online: [email or web address]

## 2023-09-24 NOTE — Progress Notes (Signed)
FOLLOW UP  Date of Service/Encounter:  09/24/23   Assessment:   Seasonal and perennial allergic rhinitis (grasses, weeds, trees, molds, cockroach, and cat) - on allergen immunotherapy with maintenance reached August 2024   Psoriasis - controlled with triamcinolone twice daily PRN   Vaccinated to COVID19  Plan/Recommendations:   1. Chronic allergic rhinitis (grasses, weeds, trees, molds, and cat) - We are going to continue with the allergy shots at the same schedule.  - We will continue with carbinoxamine 6 mg 1-2 times daily as you are doing.  - We will continue with Flonase one spray per nostril twice daily as needed.  - This will go to a specialty pharmacy and they will call or text to discuss delivery options.   2. Psoriasis - This seems to be resolved.   3. Return in about 1 year (around 09/23/2024).   Subjective:   Daniel Gibbs is a 37 y.o. male presenting today for follow up of  Chief Complaint  Patient presents with   Allergic Rhinitis     Allergy shots and medication is working     Daniel Gibbs has a history of the following: Patient Active Problem List   Diagnosis Date Noted   IBS (irritable bowel syndrome) 05/20/2020   Hemorrhoids 05/20/2020   Seasonal and perennial allergic rhinitis 07/06/2017   Acute bronchitis 01/11/2017   Cough 01/11/2017    History obtained from: chart review and patient.  Discussed the use of AI scribe software for clinical note transcription with the patient and/or guardian, who gave verbal consent to proceed.  Daniel Gibbs is a 37 y.o. male presenting for a follow up visit. He was last seen in May 2024. At that time, he made the decision to restart allergy shots. 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 So he ended up stopping. However, his symptoms worsened and he was not working at [employer] which had a much  more flexible schedule. Therefore he made the decision to restart the shots.   Since the last visit, he has done well. He remains on the carbinoxamine, but is only using this once daily at night. He does not use it in the mornings. He was on Ryaltris since I gave him a sample. However, he did not feel that this added much to his symptoms control, so he changed back to [facility] and is now using that only as needed. He does feel that the shots are helping. He is now able to rake leaves outdoors without having any symptoms at all. He is pleased with how well he is doing. Of note, he never really reached the red vial when he was on shots previously.   Daniel Gibbs is on allergen immunotherapy. He receives two injections. Immunotherapy script #1 contains molds and cockroach. He currently receives 0.27mL of the RED vial (1/100). Immunotherapy script #2 contains trees, weeds, grasses, and cat. He currently receives 0.2mL of the RED vial (1/100). He started shots May of 2024 and reached maintenance in August of 2024. We did end up adding epinephrine rinses since he was having large local reactions.   He is liking his job. 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   Otherwise, there have been no changes to his past medical history,  surgical history, family history, or social history.    Review of systems otherwise negative other than that mentioned in the HPI.    Objective:   Blood pressure 120/84, pulse 87, temperature 98.3 F (36.8 C), resp. rate 18, height 6' 0.05" (1.83 m), weight 238 lb 9.6 oz (108.2 kg), SpO2 96%. Body mass index is 32.32 kg/m.    Physical Exam Vitals reviewed.  Constitutional:      Appearance: He is well-developed.     Comments: Talkative.   HENT:     Head: Normocephalic and atraumatic.     Right Ear: Tympanic membrane, ear canal and external ear normal.     Left Ear: Tympanic membrane, ear canal  and external ear normal.     Nose: No nasal deformity, septal deviation, mucosal edema or rhinorrhea.     Right Turbinates: Enlarged. Not swollen or pale.     Left Turbinates: Enlarged. Not swollen or pale.     Right Sinus: No maxillary sinus tenderness or frontal sinus tenderness.     Left Sinus: No maxillary sinus tenderness or frontal sinus tenderness.     Comments: No nasal polyps noted.     Mouth/Throat:     Mouth: Mucous membranes are not pale and not dry.     Pharynx: Uvula midline.  Eyes:     General: Lids are normal. Allergic shiner present.        Right eye: No discharge.        Left eye: No discharge.     Conjunctiva/sclera: Conjunctivae normal.     Right eye: Right conjunctiva is not injected. No chemosis.    Left eye: Left conjunctiva is not injected. No chemosis.    Pupils: Pupils are equal, round, and reactive to light.  Cardiovascular:     Rate and Rhythm: Normal rate and regular rhythm.     Heart sounds: Normal heart sounds.  Pulmonary:     Effort: Pulmonary effort is normal. No tachypnea, accessory muscle usage or respiratory distress.     Breath sounds: Normal breath sounds. No wheezing, rhonchi or rales.  Chest:     Chest wall: No tenderness.  Lymphadenopathy:     Cervical: No cervical adenopathy.  Skin:    Coloration: Skin is not pale.     Findings: No abrasion, erythema, petechiae or rash. Rash is not papular, urticarial or vesicular.  Neurological:     Mental Status: He is alert.  Psychiatric:        Behavior: Behavior is cooperative.      Diagnostic studies: none      Malachi Bonds, MD  [facility]

## 2023-09-29 ENCOUNTER — Ambulatory Visit (INDEPENDENT_AMBULATORY_CARE_PROVIDER_SITE_OTHER): Payer: 59

## 2023-09-29 DIAGNOSIS — J309 Allergic rhinitis, unspecified: Secondary | ICD-10-CM | POA: Diagnosis not present

## 2023-10-06 ENCOUNTER — Ambulatory Visit (INDEPENDENT_AMBULATORY_CARE_PROVIDER_SITE_OTHER): Payer: 59 | Admitting: *Deleted

## 2023-10-06 DIAGNOSIS — J309 Allergic rhinitis, unspecified: Secondary | ICD-10-CM | POA: Diagnosis not present

## 2023-10-13 ENCOUNTER — Ambulatory Visit (INDEPENDENT_AMBULATORY_CARE_PROVIDER_SITE_OTHER): Payer: 59

## 2023-10-13 DIAGNOSIS — J309 Allergic rhinitis, unspecified: Secondary | ICD-10-CM | POA: Diagnosis not present

## 2023-10-29 ENCOUNTER — Ambulatory Visit (INDEPENDENT_AMBULATORY_CARE_PROVIDER_SITE_OTHER): Payer: 59

## 2023-10-29 DIAGNOSIS — J309 Allergic rhinitis, unspecified: Secondary | ICD-10-CM

## 2023-11-01 DIAGNOSIS — J302 Other seasonal allergic rhinitis: Secondary | ICD-10-CM | POA: Diagnosis not present

## 2023-11-01 NOTE — Progress Notes (Signed)
VIALS EXP 10-31-24

## 2023-11-17 ENCOUNTER — Ambulatory Visit (INDEPENDENT_AMBULATORY_CARE_PROVIDER_SITE_OTHER): Payer: 59

## 2023-11-17 DIAGNOSIS — J309 Allergic rhinitis, unspecified: Secondary | ICD-10-CM

## 2023-12-01 ENCOUNTER — Ambulatory Visit (INDEPENDENT_AMBULATORY_CARE_PROVIDER_SITE_OTHER): Payer: 59

## 2023-12-01 DIAGNOSIS — J309 Allergic rhinitis, unspecified: Secondary | ICD-10-CM | POA: Diagnosis not present

## 2023-12-08 ENCOUNTER — Ambulatory Visit (INDEPENDENT_AMBULATORY_CARE_PROVIDER_SITE_OTHER): Payer: 59 | Admitting: *Deleted

## 2023-12-08 DIAGNOSIS — J309 Allergic rhinitis, unspecified: Secondary | ICD-10-CM | POA: Diagnosis not present

## 2023-12-15 ENCOUNTER — Ambulatory Visit (INDEPENDENT_AMBULATORY_CARE_PROVIDER_SITE_OTHER): Payer: 59

## 2023-12-15 DIAGNOSIS — J309 Allergic rhinitis, unspecified: Secondary | ICD-10-CM

## 2023-12-22 ENCOUNTER — Ambulatory Visit (INDEPENDENT_AMBULATORY_CARE_PROVIDER_SITE_OTHER): Payer: 59

## 2023-12-22 DIAGNOSIS — J309 Allergic rhinitis, unspecified: Secondary | ICD-10-CM

## 2024-01-05 ENCOUNTER — Ambulatory Visit (INDEPENDENT_AMBULATORY_CARE_PROVIDER_SITE_OTHER): Payer: 59

## 2024-01-05 DIAGNOSIS — J309 Allergic rhinitis, unspecified: Secondary | ICD-10-CM | POA: Diagnosis not present

## 2024-01-12 ENCOUNTER — Ambulatory Visit (INDEPENDENT_AMBULATORY_CARE_PROVIDER_SITE_OTHER)

## 2024-01-12 DIAGNOSIS — J309 Allergic rhinitis, unspecified: Secondary | ICD-10-CM | POA: Diagnosis not present

## 2024-01-19 ENCOUNTER — Ambulatory Visit (INDEPENDENT_AMBULATORY_CARE_PROVIDER_SITE_OTHER)

## 2024-01-19 DIAGNOSIS — J309 Allergic rhinitis, unspecified: Secondary | ICD-10-CM

## 2024-02-04 ENCOUNTER — Ambulatory Visit (INDEPENDENT_AMBULATORY_CARE_PROVIDER_SITE_OTHER): Payer: Self-pay

## 2024-02-04 DIAGNOSIS — J309 Allergic rhinitis, unspecified: Secondary | ICD-10-CM

## 2024-02-07 DIAGNOSIS — J301 Allergic rhinitis due to pollen: Secondary | ICD-10-CM | POA: Diagnosis not present

## 2024-02-07 NOTE — Progress Notes (Signed)
 VIALS MADE 02-07-24

## 2024-03-01 ENCOUNTER — Ambulatory Visit (INDEPENDENT_AMBULATORY_CARE_PROVIDER_SITE_OTHER): Payer: Self-pay

## 2024-03-01 DIAGNOSIS — J309 Allergic rhinitis, unspecified: Secondary | ICD-10-CM | POA: Diagnosis not present

## 2024-03-08 ENCOUNTER — Encounter (INDEPENDENT_AMBULATORY_CARE_PROVIDER_SITE_OTHER): Payer: Self-pay | Admitting: *Deleted

## 2024-03-10 ENCOUNTER — Ambulatory Visit: Admission: EM | Admit: 2024-03-10 | Discharge: 2024-03-10 | Disposition: A | Discharge status: Home / Self Care

## 2024-03-10 ENCOUNTER — Encounter: Payer: Self-pay | Admitting: Emergency Medicine

## 2024-03-10 DIAGNOSIS — K648 Other hemorrhoids: Secondary | ICD-10-CM | POA: Diagnosis not present

## 2024-03-10 MED ORDER — HYDROCORTISONE (PERIANAL) 2.5 % EX CREA: 1.0000 | TOPICAL_CREAM | Freq: Two times a day (BID) | CUTANEOUS | 0 refills | Status: AC | PRN

## 2024-03-10 MED ORDER — HYDROCORTISONE ACETATE 25 MG RE SUPP: 25.0000 mg | Freq: Two times a day (BID) | RECTAL | 0 refills | Status: AC | PRN

## 2024-03-10 NOTE — ED Provider Notes (Signed)
 [facility]    CSN: [id number] Arrival date & time: 03/10/24  1520      History   Chief Complaint No chief complaint on file.   HPI Daniel Gibbs is a 38 y.o. male.   Presenting today with 2-week history of anal pain, intermittent bleeding, inflammation.  States a history of constipation and hemorrhoids and has had to have hemorrhoids banded in the past.  Called his GI specialist but states they are unable to see him until June.  So far trying sitz bath's, Preparation H, witch hazel, avoiding straining with minimal relief.    Past Medical History:  Diagnosis Date   Anxiety    Constipation    Diarrhea    Hemorrhoid    Nausea     Patient Active Problem List   Diagnosis Date Noted   IBS (irritable bowel syndrome) 05/20/2020   Hemorrhoids 05/20/2020   Seasonal and perennial allergic rhinitis 07/06/2017   Acute bronchitis 01/11/2017   Cough 01/11/2017    Past Surgical History:  Procedure Laterality Date   APPENDECTOMY     COLONOSCOPY WITH PROPOFOL  N/A 10/02/2020   Procedure: COLONOSCOPY WITH PROPOFOL ;  Surgeon: Urban Garden, MD;  [facility];  Service: Gastroenterology;  Laterality: N/A;  8:30, per office, pt knows new arrival time   POLYPECTOMY  10/02/2020   Procedure: POLYPECTOMY;  Surgeon: Urban Garden, MD;  [facility];  Service: Gastroenterology;;   Surgery for urinary reflux     on left and right    WRIST ARTHROSCOPY WITH FOVEAL TRIANGULAR FIBROCARTILAGE COMPLEX REPAIR Right 05/21/2015   Procedure: WRIST ARTHROSCOPY WITH TRIANGULAR FIBROCARTILAGE COMPLEX REPAIR;  Surgeon: Rober Chimera, MD;  Location: [facility];  Service: Orthopedics;  Laterality: Right;  RIGHT WRIST SCOPE WITH TFCC REPAIR.  GENERAL ANESTHESIA WITH PRE-OP BLOCK       Home Medications    Prior to Admission medications   Medication Sig Start Date End Date Taking? Authorizing Provider  DULoxetine (CYMBALTA) 20 MG  capsule Take 20 mg by mouth daily.   Yes [provider]  hydrocortisone (ANUSOL-HC) 2.5 % rectal cream Place 1 Application rectally 2 (two) times daily as needed for hemorrhoids or anal itching. 03/10/24  Yes Corbin Dess, PA-C  hydrocortisone (ANUSOL-HC) 25 MG suppository Place 1 suppository (25 mg total) rectally 2 (two) times daily as needed for hemorrhoids or anal itching. 03/10/24  Yes Corbin Dess, PA-C  acetaminophen  (TYLENOL ) 325 MG tablet Take 650 mg by mouth every 6 (six) hours as needed for headache.     [provider]  Carbinoxamine  Maleate 4 MG TABS Take 1 tablet (4 mg total) by mouth in the morning and at bedtime. 03/17/23   Rochester Chuck, MD  clonazePAM (KLONOPIN) 2 MG tablet Take 2 mg by mouth at bedtime. 02/01/22   [provider]  EPINEPHrine  0.3 mg/0.3 mL IJ SOAJ injection Inject 0.3 mg into the muscle as needed for anaphylaxis. 03/12/23   Rochester Chuck, MD  fluticasone  (FLONASE ) 50 MCG/ACT nasal spray Place 1 spray into both nostrils 2 (two) times daily. 02/19/22   Corbin Dess, PA-C  Multiple Vitamins-Minerals (ADULT ONE DAILY GUMMIES PO) Take 1 tablet by mouth daily. Men's Daily Gummy    [provider]  polyethylene glycol (MIRALAX / GLYCOLAX) 17 g packet Take 4.2 g by mouth daily.    [provider]  valACYclovir (VALTREX) 1000 MG tablet Take 1,000 mg by mouth 2 (two) times daily  as needed (outbreaks).  07/08/16   [provider]    Family History Family History  Problem Relation Age of Onset   Healthy Mother    Asthma Brother    Healthy Sister     Social History Social History   Tobacco Use   Smoking status: Never    Passive exposure: Never   Smokeless tobacco: Current    Types: Chew  Vaping Use   Vaping status: Never Used  Substance Use Topics   Alcohol use: Yes    Comment: Occa   Drug use: Never     Allergies   Bee venom, Pertussis vaccine, Sulfa antibiotics,  and Sulfate   Review of Systems Review of Systems Per HPI  Physical Exam Triage Vital Signs ED Triage Vitals  Encounter Vitals Group     BP 03/10/24 1525 108/73     Systolic BP Percentile --      Diastolic BP Percentile --      Pulse Rate 03/10/24 1525 88     Resp 03/10/24 1525 18     Temp 03/10/24 1525 98.7 F (37.1 C)     Temp Source 03/10/24 1525 Oral     SpO2 03/10/24 1525 94 %     Weight --      Height --      Head Circumference --      Peak Flow --      Pain Score 03/10/24 1526 7     Pain Loc --      Pain Education --      Exclude from Growth Chart --    No data found.  Updated Vital Signs BP 108/73 (BP Location: Right Arm)   Pulse 88   Temp 98.7 F (37.1 C) (Oral)   Resp 18   SpO2 94%   Visual Acuity Right Eye Distance:   Left Eye Distance:   Bilateral Distance:    Right Eye Near:   Left Eye Near:    Bilateral Near:     Physical Exam Vitals and nursing note reviewed. Exam conducted with a chaperone present.  Constitutional:      Appearance: Normal appearance.  HENT:     Head: Atraumatic.  Eyes:     Extraocular Movements: Extraocular movements intact.     Conjunctiva/sclera: Conjunctivae normal.  Cardiovascular:     Rate and Rhythm: Normal rate.  Pulmonary:     Effort: Pulmonary effort is normal.  Genitourinary:    Comments: Anal irritation and slightly prolapsed internal hemorrhoid present.  No obvious abscess, fissures, bleeding, drainage Musculoskeletal:        General: Normal range of motion.     Cervical back: Normal range of motion and neck supple.  Skin:    General: Skin is warm and dry.  Neurological:     General: No focal deficit present.     Mental Status: He is oriented to person, place, and time.  Psychiatric:        Mood and Affect: Mood normal.        Thought Content: Thought content normal.        Judgment: Judgment normal.      UC Treatments / Results  Labs (all labs ordered are listed, but only abnormal results  are displayed) Labs Reviewed - No data to display  EKG   Radiology No results found.  Procedures Procedures (including critical care time)  Medications Ordered in UC Medications - No data to display  Initial Impression / Assessment and Plan / UC  Course  I have reviewed the triage vital signs and the nursing notes.  Pertinent labs & imaging results that were available during my care of the patient were reviewed by me and considered in my medical decision making (see chart for details).     Vitals and exam overall reassuring today, will treat with Anusol suppository and cream, continued sitz bath's, MiraLAX and fiber for bowel regimen and close GI follow-up.  Return sooner for worsening symptoms.  Final Clinical Impressions(s) / UC Diagnoses   Final diagnoses:  Inflamed internal hemorrhoid   Discharge Instructions   None    ED Prescriptions     Medication Sig Dispense Auth. Provider   hydrocortisone (ANUSOL-HC) 25 MG suppository Place 1 suppository (25 mg total) rectally 2 (two) times daily as needed for hemorrhoids or anal itching. 30 suppository Corbin Dess, PA-C   hydrocortisone (ANUSOL-HC) 2.5 % rectal cream Place 1 Application rectally 2 (two) times daily as needed for hemorrhoids or anal itching. 80 g Corbin Dess, [place]      PDMP not reviewed this encounter.   Corbin Dess, [place] 03/10/24 [phone]

## 2024-03-10 NOTE — ED Triage Notes (Signed)
 Painful hemorrhoid x 2 weeks.

## 2024-03-15 ENCOUNTER — Ambulatory Visit (INDEPENDENT_AMBULATORY_CARE_PROVIDER_SITE_OTHER)

## 2024-03-15 DIAGNOSIS — J309 Allergic rhinitis, unspecified: Secondary | ICD-10-CM

## 2024-03-31 ENCOUNTER — Ambulatory Visit (INDEPENDENT_AMBULATORY_CARE_PROVIDER_SITE_OTHER)

## 2024-03-31 DIAGNOSIS — J309 Allergic rhinitis, unspecified: Secondary | ICD-10-CM

## 2024-04-14 ENCOUNTER — Ambulatory Visit (INDEPENDENT_AMBULATORY_CARE_PROVIDER_SITE_OTHER): Payer: Self-pay

## 2024-04-14 DIAGNOSIS — J309 Allergic rhinitis, unspecified: Secondary | ICD-10-CM

## 2024-04-14 MED ORDER — EPINEPHRINE 0.3 MG/0.3ML IJ SOAJ: 0.3000 mg | INTRAMUSCULAR | 2 refills | Status: AC | PRN

## 2024-04-17 ENCOUNTER — Ambulatory Visit (INDEPENDENT_AMBULATORY_CARE_PROVIDER_SITE_OTHER): Admitting: Gastroenterology

## 2024-04-17 ENCOUNTER — Encounter (INDEPENDENT_AMBULATORY_CARE_PROVIDER_SITE_OTHER): Payer: Self-pay | Admitting: Gastroenterology

## 2024-04-17 VITALS — BP 106/68 | HR 75 | Temp 97.6°F | Ht 73.0 in | Wt 237.8 lb

## 2024-04-17 DIAGNOSIS — K6289 Other specified diseases of anus and rectum: Secondary | ICD-10-CM | POA: Diagnosis not present

## 2024-04-17 DIAGNOSIS — K649 Unspecified hemorrhoids: Secondary | ICD-10-CM

## 2024-04-17 DIAGNOSIS — K602 Anal fissure, unspecified: Secondary | ICD-10-CM | POA: Insufficient documentation

## 2024-04-17 DIAGNOSIS — K642 Third degree hemorrhoids: Secondary | ICD-10-CM

## 2024-04-17 DIAGNOSIS — K625 Hemorrhage of anus and rectum: Secondary | ICD-10-CM | POA: Insufficient documentation

## 2024-04-17 NOTE — Patient Instructions (Signed)
-  I am sending some cream to apply 4 times per day x10-14 days.  -avoid straining, limit toilet time -consider hemorrhoid banding at a later date -keep rectal area clean and dry   Follow up 4-6 weeks  It was a pleasure to see you today. I want to create trusting relationships with patients and provide genuine, compassionate, and quality care. I truly value your feedback! please be on the lookout for a survey regarding your visit with me today. I appreciate your input about our visit and your time in completing this!    Tranquilino Fischler L. Cloteal Isaacson, MSN, APRN, AGNP-C Adult-Gerontology Nurse Practitioner [facility]

## 2024-04-17 NOTE — Progress Notes (Signed)
 Referring Provider: Fredick Jarred, PA-C Primary Care Physician:  Fredick Jarred, PA-C Primary GI Physician: New (Dr. Sammi Crick)  Chief Complaint  Patient presents with   Hemorrhoids    Pt arrives due to hemorrhoids. Has been dealing with this for a while. Pt states he does have bleeding at time. Has noticed mucus when wiping after having feeling of using bathroom in pants. Pt states painful, unable to get comfortable, not able to sleep, can not go outside due to getting hot and sweaty irritate hemorrhoid. Has tried multiple OTC products.    HPI:   Daniel Gibbs is a 38 y.o. male with past medical history of anxiety, IBS-C and hemorrhoids   Patient presenting today for:  Rectal pain, rectal bleeding, Hemorrhoids   Patient reports issues with hemorrhoids, ongoing, had previous hemorrhoid banding around 2020/2021 x1. States that he had some improvement after hemorrhoid banding. Patient states he did not really feel comfortable returning back for any further banding as he did not have a great experience. He notes that he had a lot of pain with his hemorrhoid banding.   He reports more recently he has had more issues with hemorrhoids again with rectal pain, bleeding. He notes blood on toilet tissue as well as in the toilet at times. He went to urgent care and was told he had an inflamed hemorrhoid, he was given suppositories which relieve the pain  for a short time and then he has recurrence of discomfort. Has tried preparation H, dermaplast. He notes he has to lay a certain way at night to keep the pressure off of his rectal area. He has some intermittent sharp rectal pain at times even when not having a BM. Pain worse during times he is sweating. He has pain even during times he is not using the restroom, especially when sitting. Feels he moves his bowels pretty easily, wife states he tends to sit for long periods of time but he denies straining or hard stools. Reports some yellowish  mucus/discharge noted on toilet tissue at times. Reports he can feel hemorrhoids which typically go back in on their own    Last Colonoscopy:09/2020 - The examined portion of the ileum was normal.                           - One 4 mm polyp in the transverse colon, removed                            with a cold snare. Resected and retrieved.                           - Diverticulosis in the sigmoid colon.                           - Non bleeding mucosal ulceration due to banding. Path: TA  Repeat due in 7 years   Filed Weights   04/17/24 0824  Weight: 237 lb 12.8 oz (107.9 kg)     Past Medical History:  Diagnosis Date   Anxiety    Constipation    Diarrhea    Hemorrhoid    Nausea     Past Surgical History:  Procedure Laterality Date   APPENDECTOMY     COLONOSCOPY WITH PROPOFOL  N/A 10/02/2020   Procedure: COLONOSCOPY WITH PROPOFOL ;  Surgeon: Umberto Ganong,  Bearl Limes, MD;  [facility];  Service: Gastroenterology;  Laterality: N/A;  8:30, per office, pt knows new arrival time   POLYPECTOMY  10/02/2020   Procedure: POLYPECTOMY;  Surgeon: Urban Garden, MD;  [facility];  Service: Gastroenterology;;   Surgery for urinary reflux     on left and right    WRIST ARTHROSCOPY WITH FOVEAL TRIANGULAR FIBROCARTILAGE COMPLEX REPAIR Right 05/21/2015   Procedure: WRIST ARTHROSCOPY WITH TRIANGULAR FIBROCARTILAGE COMPLEX REPAIR;  Surgeon: Rober Chimera, MD;  Location: [facility];  Service: Orthopedics;  Laterality: Right;  RIGHT WRIST SCOPE WITH TFCC REPAIR.  GENERAL ANESTHESIA WITH PRE-OP BLOCK    Current Outpatient Medications  Medication Sig Dispense Refill   acetaminophen  (TYLENOL ) 325 MG tablet Take 650 mg by mouth every 6 (six) hours as needed for headache.      Carbinoxamine  Maleate 4 MG TABS Take 1 tablet (4 mg total) by mouth in the morning and at bedtime. 60 tablet 5   clonazePAM (KLONOPIN) 2 MG tablet Take 2 mg by mouth at bedtime.      DULoxetine (CYMBALTA) 20 MG capsule Take 20 mg by mouth daily.     EPINEPHrine  0.3 mg/0.3 mL IJ SOAJ injection Inject 0.3 mg into the muscle as needed for anaphylaxis. 1 each 2   fluticasone  (FLONASE ) 50 MCG/ACT nasal spray Place 1 spray into both nostrils 2 (two) times daily. 16 g 2   hydrocortisone  (ANUSOL -HC) 2.5 % rectal cream Place 1 Application rectally 2 (two) times daily as needed for hemorrhoids or anal itching. 80 g 0   hydrocortisone  (ANUSOL -HC) 25 MG suppository Place 1 suppository (25 mg total) rectally 2 (two) times daily as needed for hemorrhoids or anal itching. 30 suppository 0   Multiple Vitamins-Minerals (ADULT ONE DAILY GUMMIES PO) Take 1 tablet by mouth daily. Men's Daily Gummy     polyethylene glycol (MIRALAX / GLYCOLAX) 17 g packet Take 4.2 g by mouth daily.     valACYclovir (VALTREX) 1000 MG tablet Take 1,000 mg by mouth 2 (two) times daily as needed (outbreaks).      No current facility-administered medications for this visit.    Allergies as of 04/17/2024 - Review Complete 04/17/2024  Allergen Reaction Noted   Bee venom Anaphylaxis 05/21/2015   Pertussis vaccine Anaphylaxis 05/12/2023   Sulfa antibiotics Hives 11/20/2017   Sulfate Hives 05/15/2015    Social History   Socioeconomic History   Marital status: Married    Spouse name: Not on file   Number of children: Not on file   Years of education: Not on file   Highest education level: Not on file  Occupational History   Not on file  Tobacco Use   Smoking status: Never    Passive exposure: Never   Smokeless tobacco: Current    Types: Chew  Vaping Use   Vaping status: Never Used  Substance and Sexual Activity   Alcohol use: Yes    Comment: Occa   Drug use: Never   Sexual activity: Not Currently    Birth control/protection: None  Other Topics Concern   Not on file  Social History Narrative   ** Merged History Encounter **       Social Drivers of Corporate investment banker Strain: Not on  file  Food Insecurity: Not on file  Transportation Needs: Not on file  Physical Activity: Not on file  Stress: Not on file  Social Connections: Not on file    Review of systems General:  negative for malaise, night sweats, fever, chills, weight loss Neck: Negative for lumps, goiter, pain and significant neck swelling Resp: Negative for cough, wheezing, dyspnea at rest CV: Negative for chest pain, leg swelling, palpitations, orthopnea GI: denies melena, nausea, vomiting, diarrhea, constipation, dysphagia, odyonophagia, early satiety or unintentional weight loss. +rectal pain +rectal bleeding  The remainder of the review of systems is noncontributory.  Physical Exam: BP 106/68   Pulse 75   Temp 97.6 F (36.4 C)   Ht 6\' 1"  (1.854 m)   Wt 237 lb 12.8 oz (107.9 kg)   BMI 31.37 kg/m  General:   Alert and oriented. No distress noted. Pleasant and cooperative.  Head:  Normocephalic and atraumatic. Eyes:  Conjuctiva clear without scleral icterus. Mouth:  Oral mucosa pink and moist. Good dentition. No lesions. Heart: Normal rate and rhythm, s1 and s2 heart sounds present.  Lungs: Clear lung sounds in all lobes. Respirations equal and unlabored. Abdomen:  +BS, soft, non-tender and non-distended. No rebound or guarding. No HSM or masses noted. Rectal; Rozann Cornell, LPN present during exam. No obvious masses externally, small opening at 9 o'clock location just proximal to anus, which appears to be a fissure. No bleeding or drainage note, DRE with discomfort but no obvious masses or lesions. Neurologic:  Alert and  oriented x4 Psych:  Alert and cooperative. Normal mood and affect.  Invalid input(s): "6 MONTHS"   ASSESSMENT: Daniel Gibbs is a 38 y.o. male presenting today for rectal pain, hemorrhoids   History of hemorrhoids with banding x1 in 2020/2021, has ongoing rectal pain, bleeding, sometimes mucus on toilet tissue. Endorses sharp rectal pain. Sometimes hemorrhoids protrude out and  go back in. Stools are soft, easy to pass. Colonoscopy in 2021 for similar symptoms. No relief with otc hemorrhoid creams. Rectal exam with concern for anal fissure. Will send [facility] hemorrhoid compound cream with diltiazem x10-14 days. Can discuss hemorrhoid banding once fissure is healed as I suspect his symptoms are related to a combination of both the fissure and hemorrhoids.    PLAN:  -CA hemorrhoid compound cream with diltiazem -avoid straining, limit toilet time -consider hemorrhoid banding at a later date -keep rectal area clean and dry   All questions were answered, patient verbalized understanding and is in agreement with plan as outlined above.   Follow Up: 4-6 weeks   Joahan Swatzell L. [place], MSN, APRN, AGNP-C Adult-Gerontology Nurse Practitioner [facility]

## 2024-04-28 ENCOUNTER — Ambulatory Visit (INDEPENDENT_AMBULATORY_CARE_PROVIDER_SITE_OTHER): Payer: Self-pay

## 2024-04-28 DIAGNOSIS — J309 Allergic rhinitis, unspecified: Secondary | ICD-10-CM

## 2024-05-10 ENCOUNTER — Ambulatory Visit (INDEPENDENT_AMBULATORY_CARE_PROVIDER_SITE_OTHER)

## 2024-05-10 DIAGNOSIS — J309 Allergic rhinitis, unspecified: Secondary | ICD-10-CM

## 2024-05-14 ENCOUNTER — Emergency Department (HOSPITAL_COMMUNITY)
Admission: EM | Admit: 2024-05-14 | Discharge: 2024-05-14 | Disposition: A | Discharge status: Home / Self Care | Attending: Emergency Medicine | Admitting: Emergency Medicine

## 2024-05-14 ENCOUNTER — Encounter (HOSPITAL_COMMUNITY): Payer: Self-pay | Admitting: *Deleted

## 2024-05-14 ENCOUNTER — Other Ambulatory Visit: Payer: Self-pay

## 2024-05-14 ENCOUNTER — Emergency Department (HOSPITAL_COMMUNITY)

## 2024-05-14 DIAGNOSIS — M542 Cervicalgia: Secondary | ICD-10-CM | POA: Diagnosis not present

## 2024-05-14 DIAGNOSIS — N179 Acute kidney failure, unspecified: Secondary | ICD-10-CM | POA: Diagnosis not present

## 2024-05-14 DIAGNOSIS — M25511 Pain in right shoulder: Secondary | ICD-10-CM | POA: Diagnosis present

## 2024-05-14 DIAGNOSIS — R079 Chest pain, unspecified: Secondary | ICD-10-CM | POA: Diagnosis not present

## 2024-05-14 DIAGNOSIS — M62838 Other muscle spasm: Secondary | ICD-10-CM | POA: Diagnosis not present

## 2024-05-14 LAB — CBC WITH DIFFERENTIAL/PLATELET
Abs Immature Granulocytes: 0.02 K/uL (ref 0.00–0.07)
Basophils Absolute: 0 K/uL (ref 0.0–0.1)
Basophils Relative: 0 %
Eosinophils Absolute: 0.1 K/uL (ref 0.0–0.5)
Eosinophils Relative: 1 %
HCT: 44.3 % (ref 39.0–52.0)
Hemoglobin: 15.1 g/dL (ref 13.0–17.0)
Immature Granulocytes: 0 %
Lymphocytes Relative: 29 %
Lymphs Abs: 2.6 K/uL (ref 0.7–4.0)
MCH: 31.1 pg (ref 26.0–34.0)
MCHC: 34.1 g/dL (ref 30.0–36.0)
MCV: 91.3 fL (ref 80.0–100.0)
Monocytes Absolute: 0.7 K/uL (ref 0.1–1.0)
Monocytes Relative: 8 %
Neutro Abs: 5.6 K/uL (ref 1.7–7.7)
Neutrophils Relative %: 62 %
Platelets: 310 K/uL (ref 150–400)
RBC: 4.85 MIL/uL (ref 4.22–5.81)
RDW: 13.5 % (ref 11.5–15.5)
WBC: 9 K/uL (ref 4.0–10.5)
nRBC: 0 % (ref 0.0–0.2)

## 2024-05-14 LAB — BASIC METABOLIC PANEL WITH GFR
Anion gap: 12 (ref 5–15)
BUN: 19 mg/dL (ref 6–20)
CO2: 24 mmol/L (ref 22–32)
Calcium: 9.3 mg/dL (ref 8.9–10.3)
Chloride: 102 mmol/L (ref 98–111)
Creatinine, Ser: 1.35 mg/dL — ABNORMAL HIGH (ref 0.61–1.24)
GFR, Estimated: >60 mL/min (ref >60)
Glucose, Bld: 114 mg/dL — ABNORMAL HIGH (ref 70–99)
Potassium: 3.7 mmol/L (ref 3.5–5.1)
Sodium: 138 mmol/L (ref 135–145)

## 2024-05-14 LAB — TROPONIN I (HIGH SENSITIVITY)
Troponin I (High Sensitivity): <2 ng/L (ref <18)
Troponin I (High Sensitivity): 2 ng/L (ref <18)

## 2024-05-14 MED ORDER — SODIUM CHLORIDE 0.9 % IV BOLUS
1000.0000 mL | Freq: Once | INTRAVENOUS | Status: AC
  Administered 2024-05-14: 1000 mL via INTRAVENOUS

## 2024-05-14 MED ORDER — KETOROLAC TROMETHAMINE 15 MG/ML IJ SOLN
15.0000 mg | Freq: Once | INTRAMUSCULAR | Status: AC
  Administered 2024-05-14: 15 mg via INTRAVENOUS
  Filled 2024-05-14: qty 1

## 2024-05-14 MED ORDER — CYCLOBENZAPRINE HCL 10 MG PO TABS: 10.0000 mg | ORAL_TABLET | Freq: Two times a day (BID) | ORAL | 0 refills | Status: AC | PRN

## 2024-05-14 MED ORDER — DIAZEPAM 5 MG/ML IJ SOLN
5.0000 mg | Freq: Once | INTRAMUSCULAR | Status: AC
  Administered 2024-05-14: 5 mg via INTRAVENOUS
  Filled 2024-05-14: qty 2

## 2024-05-14 NOTE — ED Provider Notes (Signed)
 [facility] AT [facility] Provider Note  CSN: [id number] Arrival date & time: 05/14/24 1512  Chief Complaint(s) Chest Pain  HPI Daniel Gibbs is a 38 y.o. male without significant past medical history presenting to the emergency department with right sided chest pain.  Patient reports some right-sided chest pain, shoulder pain.  Began around 1 hour ago.  Did play 18 holes of golf this morning.  Pain with moving the arm.  Reports pain to the right lateral neck as well.  Reports a tingling sensation, no numbness.  Felt lightheaded when coming to the ER.  Reports his pain is somewhat worse with deep breath but no shortness of breath.  Reported nausea earlier which is resolved.  No vomiting.   Past Medical History Past Medical History:  Diagnosis Date   Anxiety    Constipation    Diarrhea    Hemorrhoid    Nausea    Patient Active Problem List   Diagnosis Date Noted   Rectal bleeding 04/17/2024   Anal fissure 04/17/2024   IBS (irritable bowel syndrome) 05/20/2020   Hemorrhoids 05/20/2020   Seasonal and perennial allergic rhinitis 07/06/2017   Acute bronchitis 01/11/2017   Cough 01/11/2017   Home Medication(s) Prior to Admission medications   Medication Sig Start Date End Date Taking? Authorizing Provider  cyclobenzaprine  (FLEXERIL ) 10 MG tablet Take 1 tablet (10 mg total) by mouth 2 (two) times daily as needed for muscle spasms. 05/14/24  Yes Francesca Elsie CROME, MD  acetaminophen  (TYLENOL ) 325 MG tablet Take 650 mg by mouth every 6 (six) hours as needed for headache.     [provider]  Carbinoxamine  Maleate 4 MG TABS Take 1 tablet (4 mg total) by mouth in the morning and at bedtime. 03/17/23   Iva Marty Saltness, MD  clonazePAM (KLONOPIN) 2 MG tablet Take 2 mg by mouth at bedtime. 02/01/22   [provider]  DULoxetine (CYMBALTA) 20 MG capsule Take 20 mg by mouth daily.    [provider]  EPINEPHrine  0.3 mg/0.3 mL IJ SOAJ  injection Inject 0.3 mg into the muscle as needed for anaphylaxis. 04/14/24   Iva Marty Saltness, MD  fluticasone  (FLONASE ) 50 MCG/ACT nasal spray Place 1 spray into both nostrils 2 (two) times daily. 02/19/22   Stuart Vernell Norris, PA-C  hydrocortisone  (ANUSOL -HC) 2.5 % rectal cream Place 1 Application rectally 2 (two) times daily as needed for hemorrhoids or anal itching. 03/10/24   Stuart Vernell Norris, PA-C  hydrocortisone  (ANUSOL -HC) 25 MG suppository Place 1 suppository (25 mg total) rectally 2 (two) times daily as needed for hemorrhoids or anal itching. 03/10/24   Stuart Vernell Norris, PA-C  Multiple Vitamins-Minerals (ADULT ONE DAILY GUMMIES PO) Take 1 tablet by mouth daily. Men's Daily Gummy    [provider]  polyethylene glycol (MIRALAX / GLYCOLAX) 17 g packet Take 4.2 g by mouth daily.    [provider]  valACYclovir (VALTREX) 1000 MG tablet Take 1,000 mg by mouth 2 (two) times daily as needed (outbreaks).  07/08/16   [provider]  Past Surgical History Past Surgical History:  Procedure Laterality Date   APPENDECTOMY     COLONOSCOPY WITH PROPOFOL  N/A 10/02/2020   Procedure: COLONOSCOPY WITH PROPOFOL ;  Surgeon: Eartha Angelia Sieving, MD;  Location: [facility];  Service: Gastroenterology;  Laterality: N/A;  8:30, per office, pt knows new arrival time   POLYPECTOMY  10/02/2020   Procedure: POLYPECTOMY;  Surgeon: Eartha Angelia Sieving, MD;  Location: [facility];  Service: Gastroenterology;;   Surgery for urinary reflux     on left and right    WRIST ARTHROSCOPY WITH FOVEAL TRIANGULAR FIBROCARTILAGE COMPLEX REPAIR Right 05/21/2015   Procedure: WRIST ARTHROSCOPY WITH TRIANGULAR FIBROCARTILAGE COMPLEX REPAIR;  Surgeon: Alm Hummer, MD;  Location: [facility];  Service: Orthopedics;  Laterality: Right;   RIGHT WRIST SCOPE WITH TFCC REPAIR.  GENERAL ANESTHESIA WITH PRE-OP BLOCK   Family History Family History  Problem Relation Age of Onset   Healthy Mother    Asthma Brother    Healthy Sister     Social History Social History   Tobacco Use   Smoking status: Never    Passive exposure: Never   Smokeless tobacco: Current    Types: Chew  Vaping Use   Vaping status: Never Used  Substance Use Topics   Alcohol use: Yes    Comment: Occa   Drug use: Never   Allergies Bee venom, Pertussis vaccine, Sulfa antibiotics, and Sulfate  Review of Systems Review of Systems  All other systems reviewed and are negative.   Physical Exam Vital Signs  I have reviewed the triage vital signs BP 109/77 (BP Location: Left Arm)   Pulse 73   Temp 97.6 F (36.4 C) (Oral)   Resp 18   Ht 6' 1 (1.854 m)   Wt 111.1 kg   SpO2 96%   BMI 32.32 kg/m  Physical Exam Vitals and nursing note reviewed.  Constitutional:      General: He is not in acute distress.    Appearance: Normal appearance.  HENT:     Mouth/Throat:     Mouth: Mucous membranes are moist.  Eyes:     Conjunctiva/sclera: Conjunctivae normal.  Cardiovascular:     Rate and Rhythm: Normal rate and regular rhythm.     Pulses:          Radial pulses are 2+ on the right side and 2+ on the left side.  Pulmonary:     Effort: Pulmonary effort is normal. No respiratory distress.     Breath sounds: Normal breath sounds.  Abdominal:     General: Abdomen is flat.     Palpations: Abdomen is soft.     Tenderness: There is no abdominal tenderness.  Musculoskeletal:     Right lower leg: No edema.     Left lower leg: No edema.     Comments: No deformity in the right shoulder, elbow, hands.  Mild tenderness over the right trapezius  Skin:    General: Skin is warm and dry.     Capillary Refill: Capillary refill takes less than 2 seconds.  Neurological:     Mental Status: He is alert and oriented to person, place, and time. Mental status  is at baseline.     Cranial Nerves: No cranial nerve deficit.     Comments: Grip strength 5 out of 5 bilaterally.  No sensory deficit  Psychiatric:        Mood and Affect: Mood normal.        Behavior: Behavior normal.  ED Results and Treatments Labs (all labs ordered are listed, but only abnormal results are displayed) Labs Reviewed  BASIC METABOLIC PANEL WITH GFR - Abnormal; Notable for the following components:      Result Value   Glucose, Bld 114 (*)    Creatinine, Ser 1.35 (*)    All other components within normal limits  CBC WITH DIFFERENTIAL/PLATELET  TROPONIN I (HIGH SENSITIVITY)  TROPONIN I (HIGH SENSITIVITY)                                                                                                                          Radiology DG Chest Portable 1 View Result Date: 05/14/2024 CLINICAL DATA:  chest pain EXAM: PORTABLE CHEST 1 VIEW COMPARISON:  Chest x-ray 02/09/2024 FINDINGS: The heart and mediastinal contours are within normal limits. No focal consolidation. No pulmonary edema. No pleural effusion. No pneumothorax. No acute osseous abnormality. IMPRESSION: No active disease. Electronically Signed   By: Morgane  Naveau M.D.   On: 05/14/2024 16:13    Pertinent labs & imaging results that were available during my care of the patient were reviewed by me and considered in my medical decision making (see MDM for details).  Medications Ordered in ED Medications  ketorolac  (TORADOL ) 15 MG/ML injection 15 mg (15 mg Intravenous Given 05/14/24 1624)  diazepam  (VALIUM ) injection 5 mg (5 mg Intravenous Given 05/14/24 1626)  sodium chloride  0.9 % bolus 1,000 mL (0 mLs Intravenous Stopped 05/14/24 1945)                                                                                                                                     Procedures Procedures  (including critical care time)  Medical Decision Making / ED Course   MDM:  38 year old presenting with right arm  pain and right-sided chest pain.  Patient overall well-appearing, physical examination with tenderness over trapezius.  Distal pulses intact.  No neurosensory deficit.  Seems most likely musculoskeletal in origin.  Patient did play golf today.  Low concern for cardiac cause, EKG reassuring history very atypical but given chest pain will check troponin, x 2 given onset recently.  Will also check chest x-ray.  Patient is PERC negative.  Very low concern for dissection, pulses equal and symmetric, will check chest x-ray.  Will reassess.  Clinical Course as of 05/15/24 1146  Sun May 14, 2024  2000 Troponin negative x2.  Patient feels much better after medication. Labs notable for mild AKI. Possibly could be muscle spasm or cramping due to dehydration. Pt received fluids. Can fully move arm now without pain or soreness. Discussed need for follow up with PMD for re-check of creatinine. Will discharge patient to home. All questions answered. Patient comfortable with plan of discharge. Return precautions discussed with patient and specified on the after visit summary.  [WS]    Clinical Course User Index [WS] Francesca Elsie CROME, MD     Additional history obtained: -Additional history obtained from spouse -External records from outside source obtained and reviewed including: Chart review including previous notes, labs, imaging, consultation notes including prior notes    Lab Tests: -I ordered, reviewed, and interpreted labs.   The pertinent results include:   Labs Reviewed  BASIC METABOLIC PANEL WITH GFR - Abnormal; Notable for the following components:      Result Value   Glucose, Bld 114 (*)    Creatinine, Ser 1.35 (*)    All other components within normal limits  CBC WITH DIFFERENTIAL/PLATELET  TROPONIN I (HIGH SENSITIVITY)  TROPONIN I (HIGH SENSITIVITY)    Notable for mild AKI, normal troponin  EKG   EKG Interpretation Date/Time:  Sunday May 14 2024 15:23:09 EDT Ventricular Rate:   88 PR Interval:  152 QRS Duration:  80 QT Interval:  354 QTC Calculation: 428 R Axis:   61  Text Interpretation: Normal sinus rhythm Normal ECG When compared with ECG of 12-May-2023 17:27, No significant change was found Confirmed by Francesca Elsie ([id number]) on 05/14/2024 3:27:58 PM         Imaging Studies ordered: I ordered imaging studies including CXR On my interpretation imaging demonstrates no acute process I independently visualized and interpreted imaging. I agree with the radiologist interpretation   Medicines ordered and prescription drug management: Meds ordered this encounter  Medications   ketorolac  (TORADOL ) 15 MG/ML injection 15 mg   diazepam  (VALIUM ) injection 5 mg   sodium chloride  0.9 % bolus 1,000 mL   cyclobenzaprine  (FLEXERIL ) 10 MG tablet    Sig: Take 1 tablet (10 mg total) by mouth 2 (two) times daily as needed for muscle spasms.    Dispense:  20 tablet    Refill:  0    -I have reviewed the patients home medicines and have made adjustments as needed   Social Determinants of Health:  Diagnosis or treatment significantly limited by social determinants of health: obesity   Reevaluation: After the interventions noted above, I reevaluated the patient and found that their symptoms have resolved  Co morbidities that complicate the patient evaluation  Past Medical History:  Diagnosis Date   Anxiety    Constipation    Diarrhea    Hemorrhoid    Nausea       Dispostion: Disposition decision including need for hospitalization was considered, and patient discharged from emergency department.    Final Clinical Impression(s) / ED Diagnoses Final diagnoses:  Muscle spasm of right shoulder     This chart was dictated using voice recognition software.  Despite best efforts to proofread,  errors can occur which can change the documentation meaning.    Francesca Elsie CROME, MD 05/15/24 1146

## 2024-05-14 NOTE — ED Triage Notes (Signed)
 Pt c/o soreness to neck on the right, now with right arm numbness and not able to move it. Pt with mid CP only with deep breath. Had lightheadedness earlier.  Reported that pt played golf earlier today.

## 2024-05-14 NOTE — Discharge Instructions (Signed)
 We evaluated you for your chest and arm pain.  Your testing in the emergency department was reassuring.  We did not see signs of any dangerous cause of your symptoms.  Your lab test did show some dehydration.  We suspect your symptoms are most likely due to muscle cramps, probably from dehydration and exercise today.  Please take Tylenol  (acetaminophen ) and Motrin (ibuprofen) for your symptoms at home.  You can take 1000 mg of Tylenol  every 6 hours and 600 mg of Motrin every 6 hours as needed for your symptoms.  You can take these medicines together as needed, either at the same time, or alternating every 3 hours.  We have prescribed you a small amount of a muscle relaxer that you can try at home.  Do not mix this with alcohol or drive when using it as it may make you drowsy.  Please follow-up closely with your primary doctor.  Since your tests did show some dehydration, would like you to have your kidney function rechecked to make sure it is improving.  Please be sure to drink lots of fluid in the meantime.

## 2024-05-30 ENCOUNTER — Ambulatory Visit (INDEPENDENT_AMBULATORY_CARE_PROVIDER_SITE_OTHER): Admitting: Gastroenterology

## 2024-05-30 ENCOUNTER — Encounter (INDEPENDENT_AMBULATORY_CARE_PROVIDER_SITE_OTHER): Payer: Self-pay | Admitting: Gastroenterology

## 2024-05-30 VITALS — BP 105/73 | HR 84 | Temp 97.8°F | Ht 73.0 in | Wt 237.7 lb

## 2024-05-30 DIAGNOSIS — K642 Third degree hemorrhoids: Secondary | ICD-10-CM

## 2024-05-30 DIAGNOSIS — K602 Anal fissure, unspecified: Secondary | ICD-10-CM

## 2024-05-30 DIAGNOSIS — K649 Unspecified hemorrhoids: Secondary | ICD-10-CM

## 2024-05-30 DIAGNOSIS — K625 Hemorrhage of anus and rectum: Secondary | ICD-10-CM | POA: Diagnosis not present

## 2024-05-30 DIAGNOSIS — K6289 Other specified diseases of anus and rectum: Secondary | ICD-10-CM | POA: Diagnosis not present

## 2024-05-30 MED ORDER — DICYCLOMINE HCL 10 MG PO CAPS: 10.0000 mg | ORAL_CAPSULE | Freq: Three times a day (TID) | ORAL | 1 refills | Status: AC | PRN

## 2024-05-30 NOTE — Patient Instructions (Addendum)
-  Please continue to use the hemorrhoid compound cream as needed -It is important that you avoid sitting on the toilet for long periods of time or straining -Increase water  intake, aim for atleast 64 oz per day Increase fruits, veggies and whole grains, kiwi and prunes are especially good for constipation -Start taking Miralax 1 capful every day for one week. If bowel movements do not improve, increase to 1 capful every 12 hours. If after two weeks there is no improvement, increase to 1 capful every 8 hours -I am sending bentyl  to help with rectal spasms, you can take this up to every 8 hours as needed  Follow up 6 weeks

## 2024-05-30 NOTE — Progress Notes (Signed)
 Referring Provider: Dow Longs, PA-C Primary Care Physician:  Dow Longs, PA-C Primary GI Physician: Dr. Eartha   Chief Complaint  Patient presents with   Follow-up    Pt arrives for follow up. Pt recently quit chewing tobacco. The cream will work a while then he becomes uncomfortable in any position. Bleeding at times when wiping-about the same as last visit. Any time pt sweats, he has a flare.    HPI:   Daniel Gibbs is a 38 y.o. male with past medical history of anxiety, IBS-C and hemorrhoids  Patient presenting today for:  Follow up of rectal bleeding secondary to hemorrhoids and anal fissure  Last seen June, at that time had ongoing issues with hemorrhoids, banding in 2020/2021. Having rectal pain and bleeding. Sharp rectal pain at times, sitting for long periods of time exacerbates symptoms.   Recommended CA hemorrhoid compound cream with diltiazem, avoid straining, limit toilet time, consider hemorrhoid banding   Present: States initially doing CA cream with diltiazem about 3-4 times per day for the first week which helped to soothe the area but he was at the beach and sweating which irritated the rectal area. He notes that area will feel better until he has harder stools or has to strain, this will flare up his symptoms more. He is using CA compound cream PRN now. Having harder stools/needing to strain maybe 1-2 times per week. He did recently stop chewing tobacco, using zen but having more frequent BMs since this. He is seeing blood on the days he may have to sit for longer periods of time on the toilet. He also reports sharp rectal pain at times even when he is not having a BM.    09/2020 - The examined portion of the ileum was normal.                           - One 4 mm polyp in the transverse colon, removed                            with a cold snare. Resected and retrieved.                           - Diverticulosis in the sigmoid colon.                            - Non bleeding mucosal ulceration due to banding. Path: TA  Repeat due in 7 years  Filed Weights   05/30/24 0819  Weight: 237 lb 11.2 oz (107.8 kg)     Past Medical History:  Diagnosis Date   Anxiety    Constipation    Diarrhea    Hemorrhoid    Nausea     Past Surgical History:  Procedure Laterality Date   APPENDECTOMY     COLONOSCOPY WITH PROPOFOL  N/A 10/02/2020   Procedure: COLONOSCOPY WITH PROPOFOL ;  Surgeon: Eartha Angelia Sieving, MD;  [facility]: [facility];  Service: Gastroenterology;  Laterality: N/A;  8:30, per office, pt knows new arrival time   POLYPECTOMY  10/02/2020   Procedure: POLYPECTOMY;  Surgeon: Eartha Angelia Sieving, MD;  [facility]: [facility];  Service: Gastroenterology;;   Surgery for urinary reflux     on left and right    WRIST ARTHROSCOPY WITH FOVEAL TRIANGULAR FIBROCARTILAGE COMPLEX REPAIR Right 05/21/2015  Procedure: WRIST ARTHROSCOPY WITH TRIANGULAR FIBROCARTILAGE COMPLEX REPAIR;  Surgeon: Alm Hummer, MD;  Location: [facility];  Service: Orthopedics;  Laterality: Right;  RIGHT WRIST SCOPE WITH TFCC REPAIR.  GENERAL ANESTHESIA WITH PRE-OP BLOCK    Current Outpatient Medications  Medication Sig Dispense Refill   acetaminophen  (TYLENOL ) 325 MG tablet Take 650 mg by mouth every 6 (six) hours as needed for headache.      Carbinoxamine  Maleate 4 MG TABS Take 1 tablet (4 mg total) by mouth in the morning and at bedtime. 60 tablet 5   clonazePAM (KLONOPIN) 2 MG tablet Take 2 mg by mouth at bedtime.     cyclobenzaprine  (FLEXERIL ) 10 MG tablet Take 1 tablet (10 mg total) by mouth 2 (two) times daily as needed for muscle spasms. 20 tablet 0   DULoxetine (CYMBALTA) 30 MG capsule Take 30 mg by mouth daily.     EPINEPHrine  0.3 mg/0.3 mL IJ SOAJ injection Inject 0.3 mg into the muscle as needed for anaphylaxis. 1 each 2   fluticasone  (FLONASE ) 50 MCG/ACT nasal spray Place 1 spray into both nostrils 2 (two) times daily. 16 g 2    hydrocortisone  (ANUSOL -HC) 2.5 % rectal cream Place 1 Application rectally 2 (two) times daily as needed for hemorrhoids or anal itching. 80 g 0   hydrocortisone  (ANUSOL -HC) 25 MG suppository Place 1 suppository (25 mg total) rectally 2 (two) times daily as needed for hemorrhoids or anal itching. 30 suppository 0   Multiple Vitamins-Minerals (ADULT ONE DAILY GUMMIES PO) Take 1 tablet by mouth daily. Men's Daily Gummy     polyethylene glycol (MIRALAX / GLYCOLAX) 17 g packet Take 4.2 g by mouth daily.     valACYclovir (VALTREX) 1000 MG tablet Take 1,000 mg by mouth 2 (two) times daily as needed (outbreaks).      No current facility-administered medications for this visit.    Allergies as of 05/30/2024 - Review Complete 05/30/2024  Allergen Reaction Noted   Bee venom Anaphylaxis 05/21/2015   Pertussis vaccine Anaphylaxis 05/12/2023   Sulfa antibiotics Hives 11/20/2017   Sulfate Hives 05/15/2015    Social History   Socioeconomic History   Marital status: Married    Spouse name: Not on file   Number of children: Not on file   Years of education: Not on file   Highest education level: Not on file  Occupational History   Not on file  Tobacco Use   Smoking status: Never    Passive exposure: Never   Smokeless tobacco: Current    Types: Chew  Vaping Use   Vaping status: Never Used  Substance and Sexual Activity   Alcohol use: Yes    Comment: Occa   Drug use: Never   Sexual activity: Not Currently    Birth control/protection: None  Other Topics Concern   Not on file  Social History Narrative   ** Merged History Encounter **       Social Drivers of Corporate investment banker Strain: Not on file  Food Insecurity: Not on file  Transportation Needs: Not on file  Physical Activity: Not on file  Stress: Not on file  Social Connections: Not on file    Review of systems General: negative for malaise, night sweats, fever, chills, weight loss Neck: Negative for lumps, goiter,  pain and significant neck swelling Resp: Negative for cough, wheezing, dyspnea at rest CV: Negative for chest pain, leg swelling, palpitations, orthopnea GI: denies melena, nausea, vomiting, diarrhea, dysphagia, odyonophagia, early satiety or  unintentional weight loss. +rectal pain +rectal bleeding +constipation  The remainder of the review of systems is noncontributory.  Physical Exam: BP 105/73   Pulse 84   Temp 97.8 F (36.6 C)   Ht 6' 1 (1.854 m)   Wt 237 lb 11.2 oz (107.8 kg)   BMI 31.36 kg/m  General:   Alert and oriented. No distress noted. Pleasant and cooperative.  Head:  Normocephalic and atraumatic. Eyes:  Conjuctiva clear without scleral icterus. Mouth:  Oral mucosa pink and moist. Good dentition. No lesions. Heart: Normal rate and rhythm, s1 and s2 heart sounds present.  Lungs: Clear lung sounds in all lobes. Respirations equal and unlabored. Abdomen:  +BS, soft, non-tender and non-distended. No rebound or guarding. No HSM or masses noted. Derm: No palmar erythema or jaundice Neurologic:  Alert and  oriented x4 Psych:  Alert and cooperative. Normal mood and affect.  Invalid input(s): 6 MONTHS   ASSESSMENT: Daniel Gibbs is a 38 y.o. male presenting today for follow up of rectal pain and bleeding secondary to hemorrhoids and anal fissure  Patient with long history of hemorrhoids previous banding around 2020/2021 who presented at last visit with anal fissure and grade III hemorrhoids, last colonoscopy in 2021 done due to rectal bleeding, as outlined above. Some improvement with use of CA hemorrhoid compound cream with diltiazem though appears to be having flares of symptoms when he sits for periods of time on the toilet or has harder stools. Also endorses some sharp rectal pain at times even when not having a BM which may be related to anorectal spasm. We discussed importance of avoiding constipation, keeping stools nice and soft and easy to pass to allow time for  fissure healing. Recommend he start miralax, titrate up as needed with the instructions provided, continue CA hemorrhoid compound cream with diltiazem and will start bentyl  10mg  PRN for suspected anorectal spasms. Can discuss hemorrhoid banding at a later date when fissure is healed. Could also consider use of nitroglycerin at follow up if fissure still not healed. Surgical intervention would be last resort if these conservative measures fail to be beneficial for the patient.    PLAN:  -Start taking Miralax 1 capful every day for one week. If bowel movements do not improve, increase to 1 capful every 12 hours. If after two weeks there is no improvement, increase to 1 capful every 8 hours -continue CA hemorrhoid compound cream with dilt PRN -Increase water  intake, aim for atleast 64 oz per day Increase fruits, veggies and whole grains, kiwi and prunes are especially good for constipation -bentyl  10mg  q8h PRN -avoid straining, limit toilet time -consider addition of nitroglycerin in place of diltiazem for fissure if the above interventions fail to provide relief -would surgical intervention as last resort if anal fissure fails to heal with conservative management   All questions were answered, patient verbalized understanding and is in agreement with plan as outlined above.    Follow Up: 6 weeks   Tunis Gentle L. Mariette, MSN, APRN, AGNP-C Adult-Gerontology Nurse Practitioner [facility]   I have reviewed the note and agree with the APP's assessment as described in this progress note  Toribio Fortune, MD Gastroenterology and Hepatology [facility]

## 2024-06-09 ENCOUNTER — Ambulatory Visit (INDEPENDENT_AMBULATORY_CARE_PROVIDER_SITE_OTHER): Payer: Self-pay

## 2024-06-09 DIAGNOSIS — J309 Allergic rhinitis, unspecified: Secondary | ICD-10-CM

## 2024-06-09 MED ORDER — CARBINOXAMINE MALEATE 4 MG PO TABS: 1.0000 | ORAL_TABLET | Freq: Two times a day (BID) | ORAL | 0 refills | Status: AC

## 2024-06-23 ENCOUNTER — Ambulatory Visit (INDEPENDENT_AMBULATORY_CARE_PROVIDER_SITE_OTHER)

## 2024-06-23 DIAGNOSIS — J309 Allergic rhinitis, unspecified: Secondary | ICD-10-CM

## 2024-07-11 ENCOUNTER — Ambulatory Visit: Admitting: Gastroenterology

## 2024-07-12 ENCOUNTER — Ambulatory Visit (INDEPENDENT_AMBULATORY_CARE_PROVIDER_SITE_OTHER)

## 2024-07-12 DIAGNOSIS — J309 Allergic rhinitis, unspecified: Secondary | ICD-10-CM

## 2024-07-18 ENCOUNTER — Encounter (INDEPENDENT_AMBULATORY_CARE_PROVIDER_SITE_OTHER): Payer: Self-pay | Admitting: Gastroenterology

## 2024-07-18 ENCOUNTER — Ambulatory Visit (INDEPENDENT_AMBULATORY_CARE_PROVIDER_SITE_OTHER): Admitting: Gastroenterology

## 2024-07-18 VITALS — BP 119/73 | HR 81 | Temp 97.1°F | Ht 73.0 in | Wt 229.7 lb

## 2024-07-18 DIAGNOSIS — K602 Anal fissure, unspecified: Secondary | ICD-10-CM

## 2024-07-18 DIAGNOSIS — K642 Third degree hemorrhoids: Secondary | ICD-10-CM

## 2024-07-18 NOTE — Progress Notes (Signed)
 Referring Provider: Dow Longs, PA-C Primary Care Physician:  Dow Longs, PA-C Primary GI Physician: Dr. Eartha   Chief Complaint  Patient presents with   Follow-up    Patient here today for a follow up. Patient says he is feeling a lot better since losing weight. He is not currently having any issues with the anal fissures. Denies any rectal bleeding,dark of red blood seen.    HPI:   Daniel Gibbs is a 38 y.o. male with past medical history of  anxiety, IBS-C and hemorrhoids   Patient presenting today for follow up of:  hemorrhoids and anal fissure  Last seen July, at that time, doing [facility] hemorrhoid cream with diltiazem 3-4 times per day initially which helped but having more hard stools and using cream PRN.   Recommended miralax, continue CA compound cream with diltiazem PRN, bentyl  10mg  q8h PRN, avoid straining, consider addition of nitroglycerin in place of dilt if anal fissure not improving, consider surgical intervention for fissure as last resort if failure to improve  Present:  Doing miralax daily, has cut out sugar, avoids eating late, not after 8pm now. He has lost some weight changing his diet. He does not have any issues with his fissure now as it feels healed. Was Using cream as needed but has not needed it in the past 2-3 weeks. He denies rectal bleeding. Does endorse some issues still with hemorrhoids as he notes he got sweaty playing golf a week or so ago and felt them come out again but has not had to use anything for them as they have not been very bothersome. Having a BM usually daily, sometimes may have a second BM thereafter, not having to strain. He does not take his phone to the restroom now in order to avoid sitting for long periods on the toilet. Overall, feeling much better from a GI standpoint.  09/2020 - The examined portion of the ileum was normal.                           - One 4 mm polyp in the transverse colon, removed                             with a cold snare. Resected and retrieved.                           - Diverticulosis in the sigmoid colon.                           - Non bleeding mucosal ulceration due to banding. Path: TA  Repeat due in 7 years  Filed Weights   07/18/24 1153  Weight: 229 lb 11.2 oz (104.2 kg)     Past Medical History:  Diagnosis Date   Anxiety    Constipation    Diarrhea    Hemorrhoid    Nausea     Past Surgical History:  Procedure Laterality Date   APPENDECTOMY     COLONOSCOPY WITH PROPOFOL  N/A 10/02/2020   Procedure: COLONOSCOPY WITH PROPOFOL ;  Surgeon: Eartha Angelia Sieving, MD;  [facility]: [facility];  Service: Gastroenterology;  Laterality: N/A;  8:30, per office, pt knows new arrival time   POLYPECTOMY  10/02/2020   Procedure: POLYPECTOMY;  Surgeon: Eartha Angelia Sieving, MD;  Location: [facility];  Service: Gastroenterology;;   Surgery for urinary reflux     on left and right    WRIST ARTHROSCOPY WITH FOVEAL TRIANGULAR FIBROCARTILAGE COMPLEX REPAIR Right 05/21/2015   Procedure: WRIST ARTHROSCOPY WITH TRIANGULAR FIBROCARTILAGE COMPLEX REPAIR;  Surgeon: Alm Hummer, MD;  Location: [facility];  Service: Orthopedics;  Laterality: Right;  RIGHT WRIST SCOPE WITH TFCC REPAIR.  GENERAL ANESTHESIA WITH PRE-OP BLOCK    Current Outpatient Medications  Medication Sig Dispense Refill   acetaminophen  (TYLENOL ) 325 MG tablet Take 650 mg by mouth every 6 (six) hours as needed for headache.      Carbinoxamine  Maleate 4 MG TABS Take 1 tablet (4 mg total) by mouth in the morning and at bedtime. 180 tablet 0   clonazePAM (KLONOPIN) 2 MG tablet Take 2 mg by mouth at bedtime.     cyclobenzaprine  (FLEXERIL ) 10 MG tablet Take 1 tablet (10 mg total) by mouth 2 (two) times daily as needed for muscle spasms. 20 tablet 0   dicyclomine  (BENTYL ) 10 MG capsule Take 1 capsule (10 mg total) by mouth every 8 (eight) hours as needed for spasms. 60 capsule 1   DULoxetine  (CYMBALTA) 30 MG capsule Take 30 mg by mouth daily.     EPINEPHrine  0.3 mg/0.3 mL IJ SOAJ injection Inject 0.3 mg into the muscle as needed for anaphylaxis. 1 each 2   fluticasone  (FLONASE ) 50 MCG/ACT nasal spray Place 1 spray into both nostrils 2 (two) times daily. 16 g 2   hydrocortisone  (ANUSOL -HC) 2.5 % rectal cream Place 1 Application rectally 2 (two) times daily as needed for hemorrhoids or anal itching. 80 g 0   hydrocortisone  (ANUSOL -HC) 25 MG suppository Place 1 suppository (25 mg total) rectally 2 (two) times daily as needed for hemorrhoids or anal itching. 30 suppository 0   Multiple Vitamins-Minerals (ADULT ONE DAILY GUMMIES PO) Take 1 tablet by mouth daily. Men's Daily Gummy     polyethylene glycol (MIRALAX / GLYCOLAX) 17 g packet Take 4.2 g by mouth daily.     valACYclovir (VALTREX) 1000 MG tablet Take 1,000 mg by mouth 2 (two) times daily as needed (outbreaks).      No current facility-administered medications for this visit.    Allergies as of 07/18/2024 - Review Complete 07/18/2024  Allergen Reaction Noted   Bee venom Anaphylaxis 05/21/2015   Pertussis vaccine Anaphylaxis 05/12/2023   Sulfa antibiotics Hives 11/20/2017   Sulfate Hives 05/15/2015    Social History   Socioeconomic History   Marital status: Married    Spouse name: Not on file   Number of children: Not on file   Years of education: Not on file   Highest education level: Not on file  Occupational History   Not on file  Tobacco Use   Smoking status: Never    Passive exposure: Never   Smokeless tobacco: Current    Types: Chew  Vaping Use   Vaping status: Never Used  Substance and Sexual Activity   Alcohol use: Yes    Comment: Occa   Drug use: Never   Sexual activity: Not Currently    Birth control/protection: None  Other Topics Concern   Not on file  Social History Narrative   ** Merged History Encounter **       Social Drivers of Corporate investment banker Strain: Not on file  Food  Insecurity: Not on file  Transportation Needs: Not on file  Physical Activity: Not on file  Stress: Not on file  Social Connections:  Not on file    Review of systems General: negative for malaise, night sweats, fever, chills, weight loss Neck: Negative for lumps, goiter, pain and significant neck swelling Resp: Negative for cough, wheezing, dyspnea at rest CV: Negative for chest pain, leg swelling, palpitations, orthopnea GI: denies melena, hematochezia, nausea, vomiting, diarrhea, constipation, dysphagia, odyonophagia, early satiety or unintentional weight loss. +hemorrhoids  MSK: Negative for joint pain or swelling, back pain, and muscle pain. Derm: Negative for itching or rash Psych: Denies depression, anxiety, memory loss, confusion. No homicidal or suicidal ideation.  Heme: Negative for prolonged bleeding, bruising easily, and swollen nodes. Endocrine: Negative for cold or heat intolerance, polyuria, polydipsia and goiter. Neuro: negative for tremor, gait imbalance, syncope and seizures. The remainder of the review of systems is noncontributory.  Physical Exam: BP 119/73 (BP Location: Left Arm, Patient Position: Sitting, Cuff Size: Normal)   Pulse 81   Temp (!) 97.1 F (36.2 C) (Temporal)   Ht 6' 1 (1.854 m)   Wt 229 lb 11.2 oz (104.2 kg)   BMI 30.31 kg/m  General:   Alert and oriented. No distress noted. Pleasant and cooperative.  Head:  Normocephalic and atraumatic. Eyes:  Conjuctiva clear without scleral icterus. Mouth:  Oral mucosa pink and moist. Good dentition. No lesions. Heart: Normal rate and rhythm, s1 and s2 heart sounds present.  Lungs: Clear lung sounds in all lobes. Respirations equal and unlabored. Abdomen:  +BS, soft, non-tender and non-distended. No rebound or guarding. No HSM or masses noted. Derm: No palmar erythema or jaundice Msk:  Symmetrical without gross deformities. Normal posture. Extremities:  Without edema. Neurologic:  Alert and  oriented  x4 Psych:  Alert and cooperative. Normal mood and affect.  Invalid input(s): 6 MONTHS   ASSESSMENT: Daniel Gibbs is a 38 y.o. male presenting today for follow up of hemorrhoids and anal fissure  Patient with long history of hemorrhoids with previous banding around 2020/2021 with last colonoscopy in 2021 done due to rectal bleeding, as outlined above. He presented in June with anal fissure and hemorrhoid which were treated with [facility] hemorrhoid cream with diltiazem, some improvement at last visit though still with some constipation, he was recommended to start miralax which seems to have helped tremendously. He feels fissure has healed, having easy to pass stools 1-2 times per day with no further rectal bleeding. Had an episode of hemorrhoid flaring after getting hot while out playing golf but has not had to use cream as he has not felt bothered by this. He has changed his diet and actually lost some weight as well which I congratulated him on. Overall he seems to be doing much better from GI standpoint. Can continue miralax daily, CA hemorrhoid compound cream PRN, avoid straining, limit toilet time and continue with dietary changes as he is doing.  PLAN:  -continue miralax daily -continue good water  intake -avoid straining, limit toilet time  -CA hemorrhoid compound cream PRN -Increase water  intake, aim for atleast 64 oz per day -Increase fruits, veggies and whole grains, kiwi and prunes are especially good for constipation -continue with dietary changes  All questions were answered, patient verbalized understanding and is in agreement with plan as outlined above.   Follow Up: 6 months   Tessla Spurling L. Mariette, MSN, APRN, AGNP-C Adult-Gerontology Nurse Practitioner [facility]  I have reviewed the note and agree with the APP's assessment as described in this progress note  Toribio Fortune, MD Gastroenterology and Hepatology [facility]  Gastroenterology

## 2024-07-18 NOTE — Patient Instructions (Signed)
-  continue miralax daily -avoid straining, limit toilet time  -Increase water  intake, aim for atleast 64 oz per day -Increase fruits, veggies and whole grains, kiwi and prunes are especially good for constipation -continue with dietary changes -you can use [facility] cream as needed for hemorrhoids as well, please let me know if you need a refill  Follow up 6 months  It was a pleasure to see you today. I want to create trusting relationships with patients and provide genuine, compassionate, and quality care. I truly value your feedback! please be on the lookout for a survey regarding your visit with me today. I appreciate your input about our visit and your time in completing this!    Daniel Gibbs L. Chrislyn Seedorf, MSN, APRN, AGNP-C Adult-Gerontology Nurse Practitioner [facility] at [facility]

## 2024-07-21 ENCOUNTER — Ambulatory Visit (INDEPENDENT_AMBULATORY_CARE_PROVIDER_SITE_OTHER)

## 2024-07-21 DIAGNOSIS — J309 Allergic rhinitis, unspecified: Secondary | ICD-10-CM

## 2024-07-28 ENCOUNTER — Ambulatory Visit (INDEPENDENT_AMBULATORY_CARE_PROVIDER_SITE_OTHER): Payer: Self-pay

## 2024-07-28 DIAGNOSIS — J309 Allergic rhinitis, unspecified: Secondary | ICD-10-CM | POA: Diagnosis not present

## 2024-08-25 ENCOUNTER — Ambulatory Visit

## 2024-08-25 DIAGNOSIS — J309 Allergic rhinitis, unspecified: Secondary | ICD-10-CM

## 2024-08-30 ENCOUNTER — Ambulatory Visit (INDEPENDENT_AMBULATORY_CARE_PROVIDER_SITE_OTHER)

## 2024-08-30 DIAGNOSIS — J309 Allergic rhinitis, unspecified: Secondary | ICD-10-CM

## 2024-09-06 ENCOUNTER — Ambulatory Visit (INDEPENDENT_AMBULATORY_CARE_PROVIDER_SITE_OTHER)

## 2024-09-06 DIAGNOSIS — J309 Allergic rhinitis, unspecified: Secondary | ICD-10-CM | POA: Diagnosis not present

## 2024-09-13 ENCOUNTER — Ambulatory Visit (INDEPENDENT_AMBULATORY_CARE_PROVIDER_SITE_OTHER)

## 2024-09-13 DIAGNOSIS — J309 Allergic rhinitis, unspecified: Secondary | ICD-10-CM | POA: Diagnosis not present

## 2024-09-22 ENCOUNTER — Other Ambulatory Visit: Payer: Self-pay

## 2024-09-22 ENCOUNTER — Ambulatory Visit: Payer: 59 | Admitting: Family Medicine

## 2024-09-22 ENCOUNTER — Encounter: Payer: Self-pay | Admitting: Family Medicine

## 2024-09-22 VITALS — BP 120/70 | HR 90 | Temp 98.3°F | Resp 18 | Ht 72.44 in | Wt 226.0 lb

## 2024-09-22 DIAGNOSIS — J302 Other seasonal allergic rhinitis: Secondary | ICD-10-CM

## 2024-09-22 DIAGNOSIS — J3089 Other allergic rhinitis: Secondary | ICD-10-CM

## 2024-09-22 NOTE — Progress Notes (Signed)
 [address] Dept: [phone]  FOLLOW UP NOTE  Patient ID: Daniel Gibbs, male    DOB: [birth year]  Age: 38 y.o. MRN: [id number] Date of Office Visit: 09/22/2024  Assessment  Chief Complaint: Follow-up (Pt presents to the office for yearly. Pt has no complaints.)  HPI Daniel Gibbs is a 38 year old male who presents to the clinic for a follow up visit. He was last seen in this clinic on 09/24/2023 by Dr. Iva for evaluation of allergic rhinitis.   At today's visit, he reports his allergic rhinitis has been well controlled with only infrequent nasal symptoms while outside doing lawn work. He continues carbinoxamine  4 mg once a day and occasionally takes a second dose with relief of symptoms. He is not needing a steroid or nasal saline rinse at this time. He continues allergen immunotherapy and reached maintence dose in August 2024. He denies any large or local reactions. He reports a significant improvement in his symptoms of allergic rhinitis while continuing on allergen immunotherapy.   His current medications are listed in the chart.  Drug Allergies:  Allergies  Allergen Reactions   Bee Venom Anaphylaxis   Pertussis Vaccine Anaphylaxis   Sulfa Antibiotics Hives   Sulfate Hives    Physical Exam: BP 120/70 (BP Location: Left Arm, Cuff Size: Small)   Pulse 90   Temp 98.3 F (36.8 C) (Temporal)   Resp 18   Ht 6' 0.44 (1.84 m)   Wt 226 lb (102.5 kg)   SpO2 95%   BMI 30.28 kg/m    Physical Exam Vitals reviewed.  Constitutional:      Appearance: Normal appearance.  HENT:     Head: Normocephalic and atraumatic.     Right Ear: Tympanic membrane normal.     Left Ear: Tympanic membrane normal.     Nose:     Comments: Bilateral nares slightly erythematous with thin clear nasal drainage noted. Pharynx normal. Ears normal. Eyes normal.     Mouth/Throat:     Pharynx: Oropharynx is clear.  Eyes:     Conjunctiva/sclera:  Conjunctivae normal.  Cardiovascular:     Rate and Rhythm: Normal rate and regular rhythm.     Heart sounds: Normal heart sounds. No murmur heard. Pulmonary:     Effort: Pulmonary effort is normal.     Breath sounds: Normal breath sounds.     Comments: Lungs clear to auscultation Musculoskeletal:        General: Normal range of motion.     Cervical back: Normal range of motion and neck supple.  Skin:    General: Skin is warm and dry.  Neurological:     Mental Status: He is alert and oriented to person, place, and time.  Psychiatric:        Mood and Affect: Mood normal.        Behavior: Behavior normal.        Thought Content: Thought content normal.        Judgment: Judgment normal.     Assessment and Plan: 1. Seasonal and perennial allergic rhinitis     Patient Instructions  Allergic rhinitis Continue allergen avoidance measures directed toward pollen, cat, mold, and cockroach as listed below Continue allergen immunotherapy and have access to an epinephrine  device per protocol Continue carbinoxamine  4 mg once or twice a day if needed for runny nose or itch Consider saline nasal rinses as needed for nasal symptoms. Use this before any medicated nasal  sprays for best result  Call the clinic if this treatment plan is not working well for you.  Follow up in 1 year or sooner if needed.  Return in about 1 year (around 09/22/2025), or if symptoms worsen or fail to improve.    Thank you for the opportunity to care for this patient.  Please do not hesitate to contact me with questions.  Arlean Mutter, FNP [facility] 

## 2024-09-22 NOTE — Patient Instructions (Signed)
 Allergic rhinitis Continue allergen avoidance measures directed toward pollen, cat, mold, and cockroach as listed below Continue allergen immunotherapy and have access to an epinephrine  device per protocol Continue carbinoxamine  4 mg once or twice a day if needed for runny nose or itch Consider saline nasal rinses as needed for nasal symptoms. Use this before any medicated nasal sprays for best result  Call the clinic if this treatment plan is not working well for you.  Follow up in 1 year or sooner if needed.

## 2024-10-13 ENCOUNTER — Ambulatory Visit

## 2024-10-13 DIAGNOSIS — J302 Other seasonal allergic rhinitis: Secondary | ICD-10-CM

## 2024-10-13 DIAGNOSIS — J3089 Other allergic rhinitis: Secondary | ICD-10-CM | POA: Diagnosis not present

## 2024-11-15 ENCOUNTER — Ambulatory Visit

## 2024-11-15 DIAGNOSIS — J302 Other seasonal allergic rhinitis: Secondary | ICD-10-CM

## 2024-11-20 DIAGNOSIS — J302 Other seasonal allergic rhinitis: Secondary | ICD-10-CM | POA: Diagnosis not present

## 2024-11-20 NOTE — Progress Notes (Signed)
 VIALS MADE ON 11/20/24

## 2025-01-18 ENCOUNTER — Ambulatory Visit (INDEPENDENT_AMBULATORY_CARE_PROVIDER_SITE_OTHER): Admitting: Gastroenterology

## 2025-10-03 ENCOUNTER — Ambulatory Visit: Admitting: Allergy & Immunology
